# Patient Record
Sex: Female | Born: 1935 | Race: White | Hispanic: No | Marital: Married | Smoking: Never smoker
Health system: Southern US, Community
[De-identification: ages and names within clinical notes are randomized; demographics above are authoritative.]

## PROBLEM LIST (undated history)

## (undated) DIAGNOSIS — M810 Age-related osteoporosis without current pathological fracture: Secondary | ICD-10-CM

## (undated) DIAGNOSIS — I10 Essential (primary) hypertension: Secondary | ICD-10-CM

## (undated) HISTORY — DX: Essential (primary) hypertension: I10

## (undated) HISTORY — PX: OTHER SURGICAL HISTORY: SHX169

## (undated) HISTORY — DX: Age-related osteoporosis without current pathological fracture: M81.0

---

## 2011-11-28 IMAGING — MG MAMMOGRAPHY DIAGNOSTIC BILATERAL 3D TOMOSYNTHESIS WITH CAD
12 of 15 series · 12 of 31 positions shown · non-contrast
Comparison: none

MAMMOGRAPHY DIAGNOSTIC BILATERAL WITH CAD, 11/28/11:
HISTORY: Left lumpectomy and radiation therapy.
TECHNIQUE: Digital mammograms were obtained.  These were interpreted both
primarily and with the aid of a computer assisted detection system.
BREAST DENSITY:  (Level 3 of 4) The breast tissue is heterogeneously dense.
This may lower the sensitivity of mammography.

[L CC synth-2D]
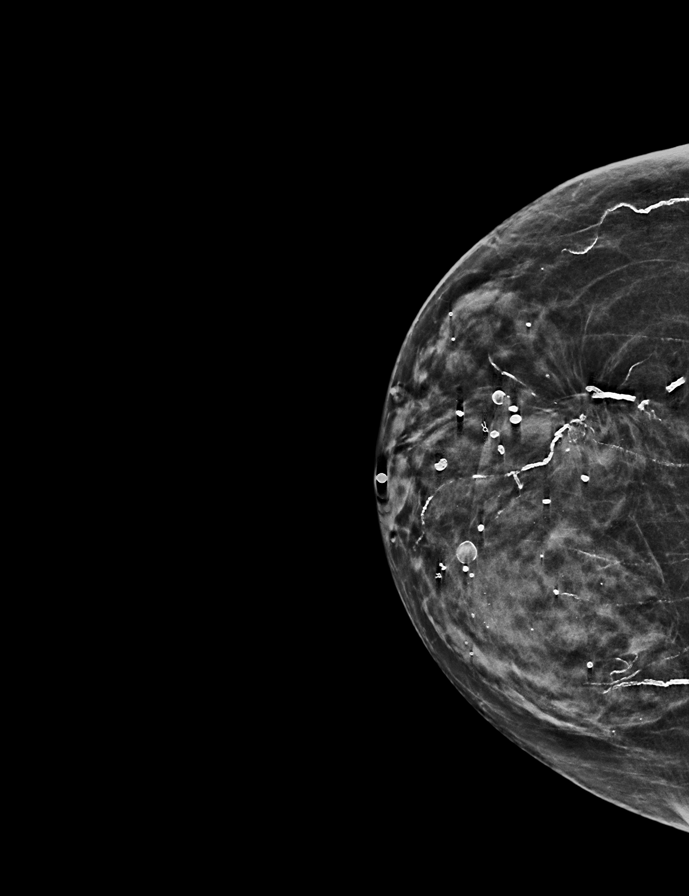

[L CC]
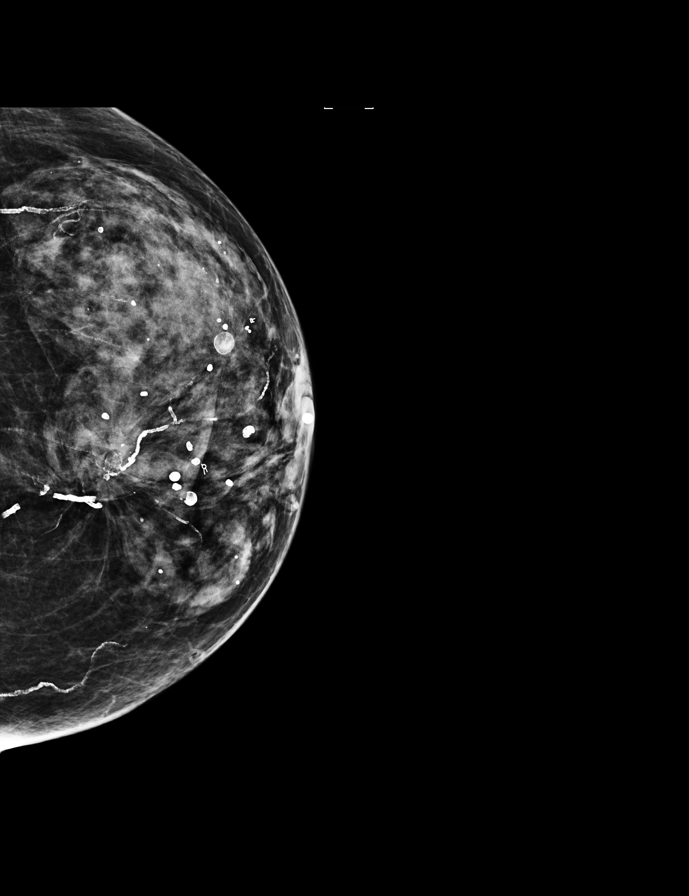

[L MLO]
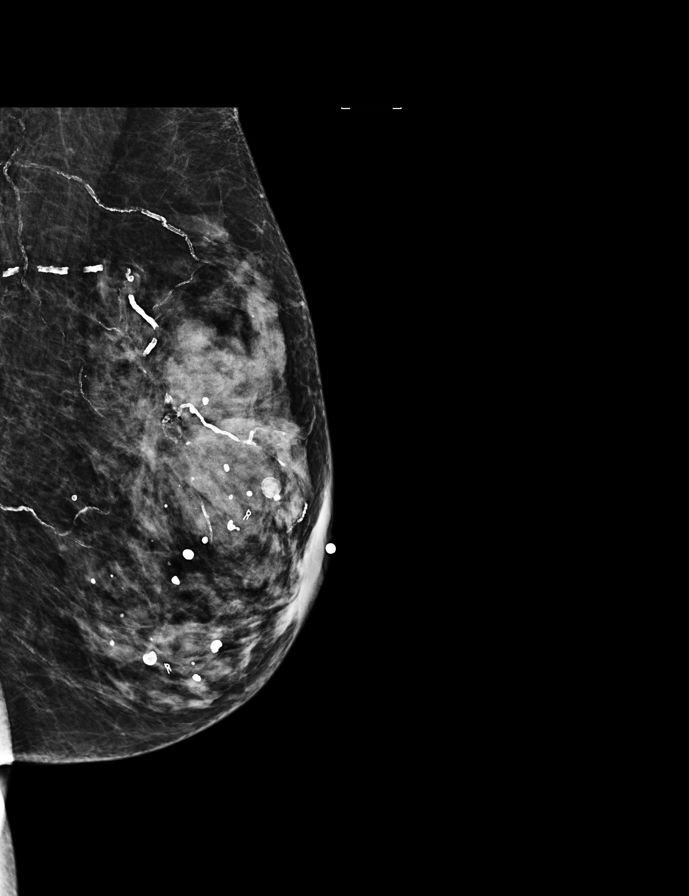

[R MLO]
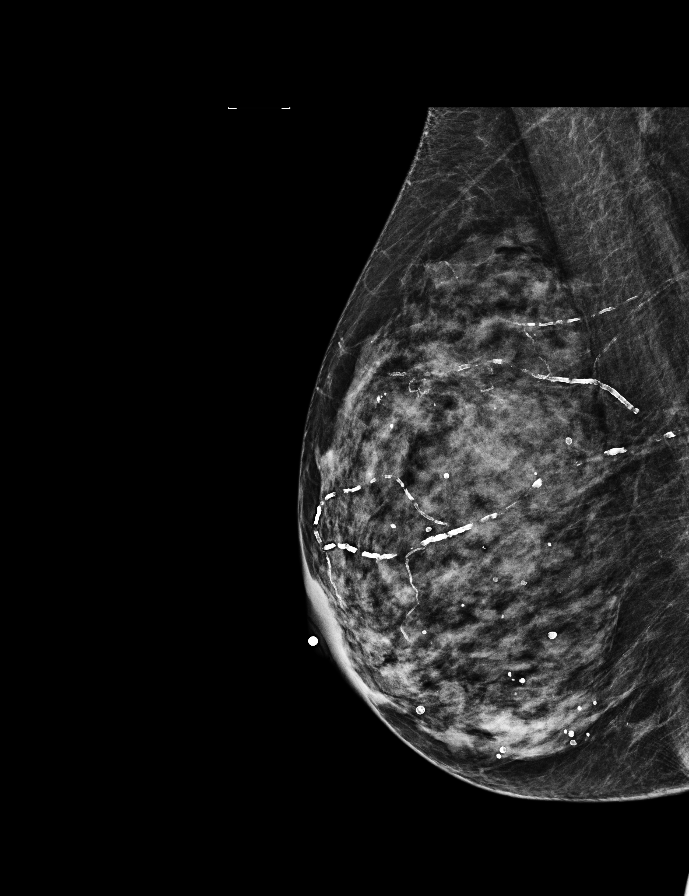

[L MLO synth-2D]
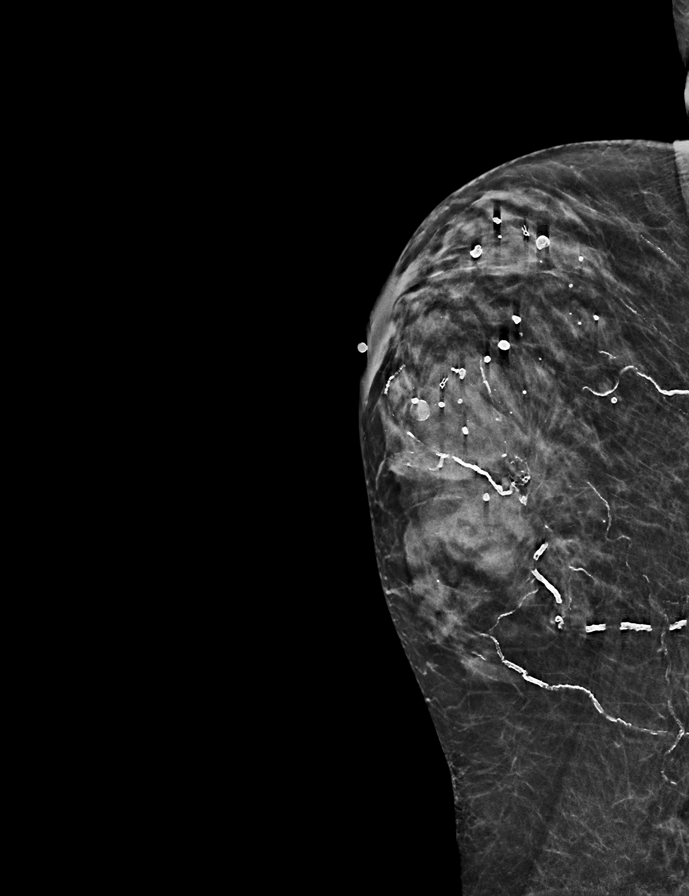

[R MLO synth-2D]
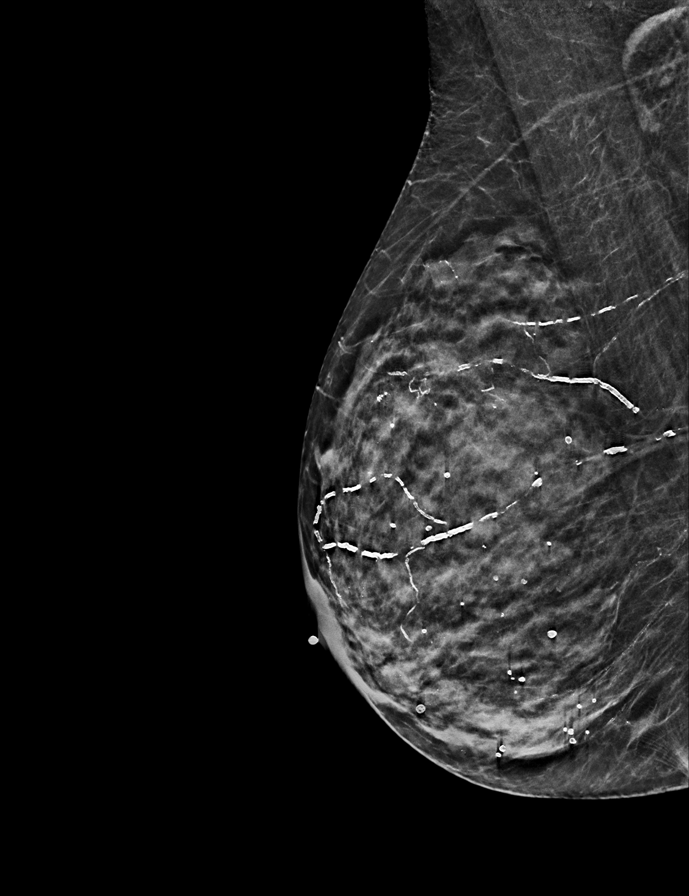

[R CC synth-2D]
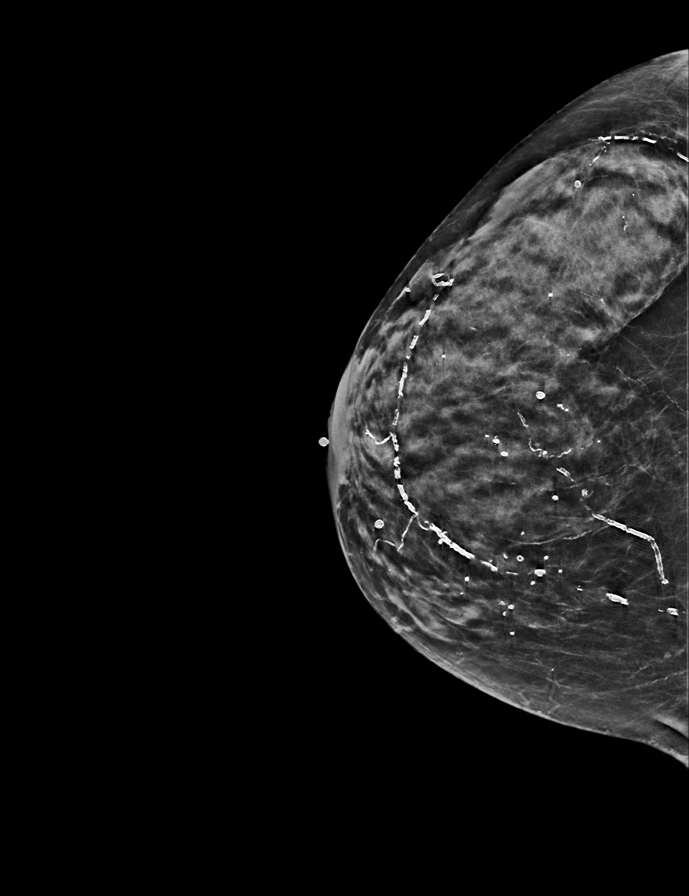

[L MLO tomo]
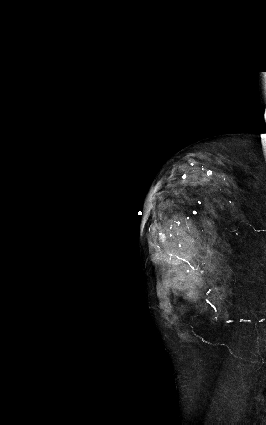

[R CC tomo]
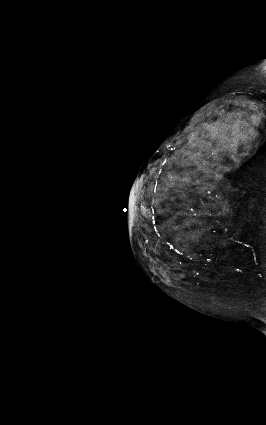

[L CC tomo]
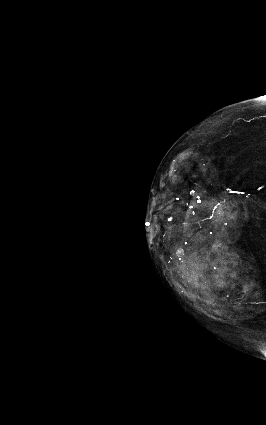

[R MLO tomo (1 of 2)]
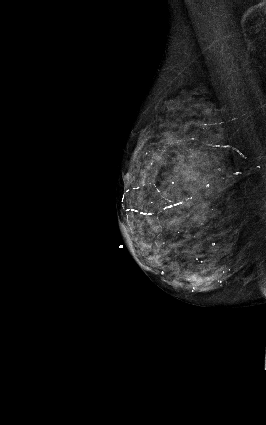

[R MLO tomo (2 of 2) · tomo slice 24/47.0]
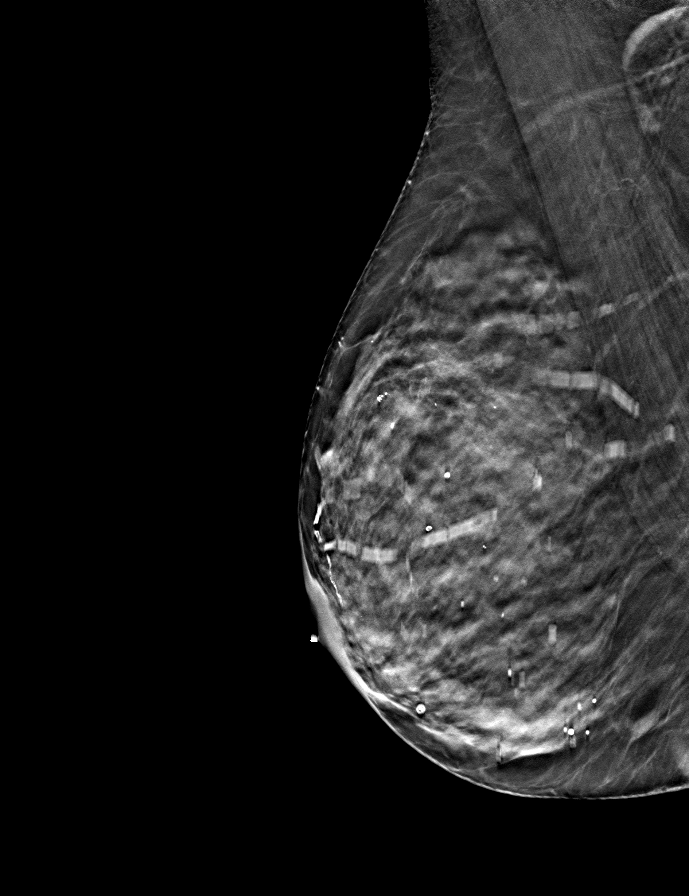

[12 of 31 positions shown; findings below may reference images not displayed]

FINDINGS: There are numerous benign appearing calcifications.  Post
treatment changes in the upper central left breast appear stable.  There is
no dominant or new, developing mass or suspicious cluster of
microcalcifications.  There is no skin thickening, architectural distortion
or nipple retraction.
IMPRESSION: (BI-RADS 2) Benign findings.  Routine mammographic follow-up is
recommended.

## 2012-11-28 IMAGING — MG MAMMOGRAPHY DIAGNOSTIC BILATERAL 3D TOMOSYNTHESIS WITH CAD
12 of 16 series · 12 of 32 positions shown · non-contrast
Comparison: Exams dating back to April 07, 2008
BREAST DENSITY: ( Level 4 of 4) The breast tissue is extremely dense which
could
obscure a lesion on mammography.

MAMMOGRAPHY DIAGNOSTIC BILATERAL 3D TOMOSYNTHESIS WITH CAD, 11/28/2012 [DATE]:
HISTORY: Radiation therapy and lumpectomy left breast 7331
TECHNIQUE: Digital mammograms and 3-D Tomosynthesis were obtained. These were
interpreted both primarily and with the aid of computer-aided detection
system.

[R MLO]
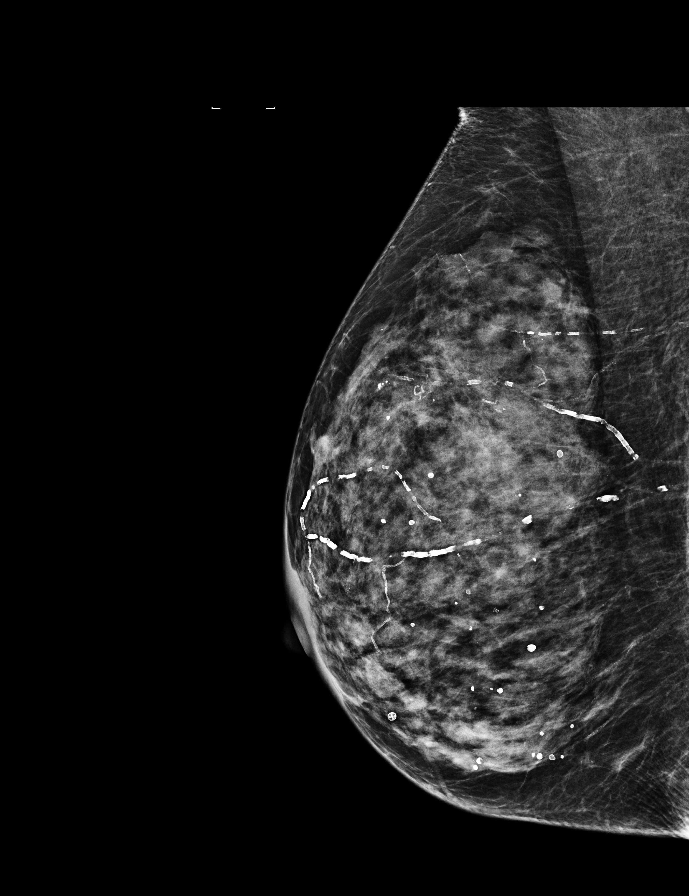

[L MLO]
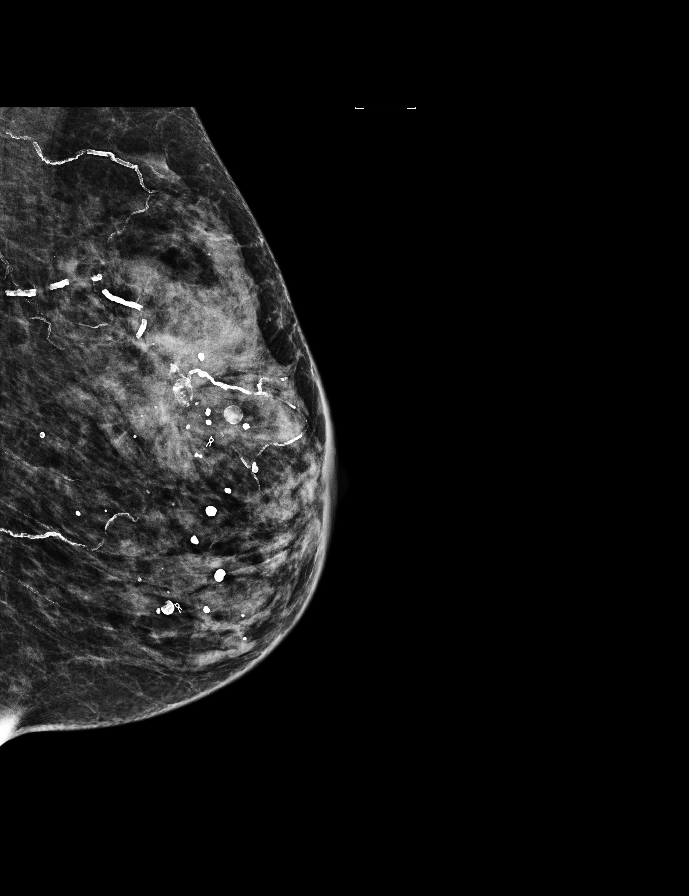

[R CC synth-2D]
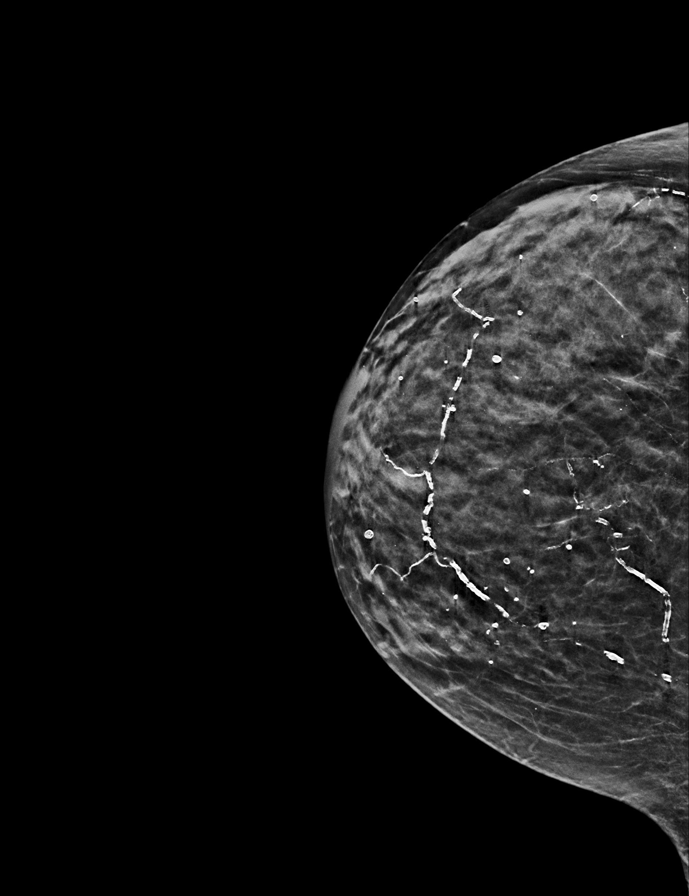

[L CC synth-2D]
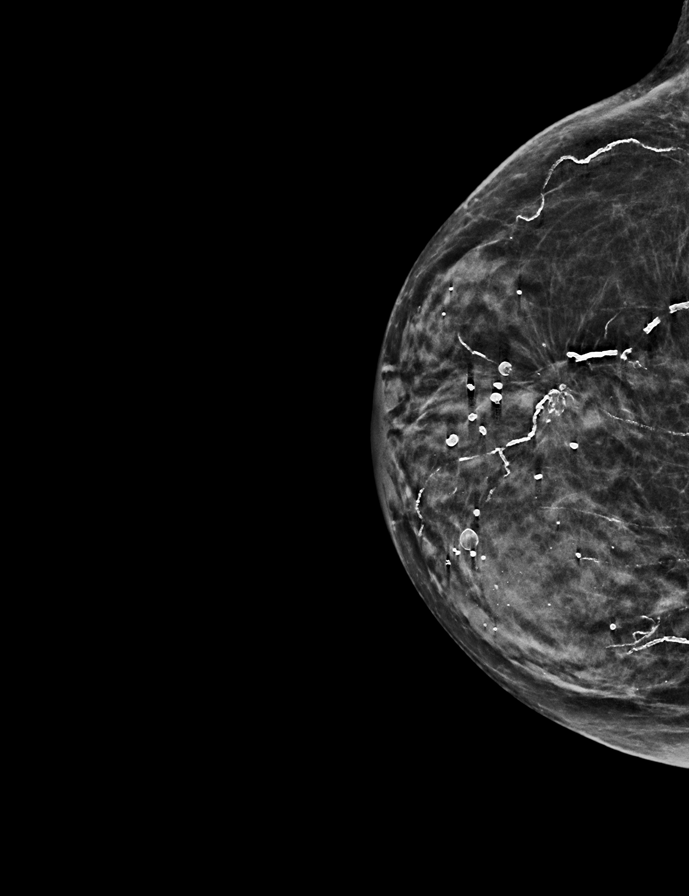

[L MLO synth-2D]
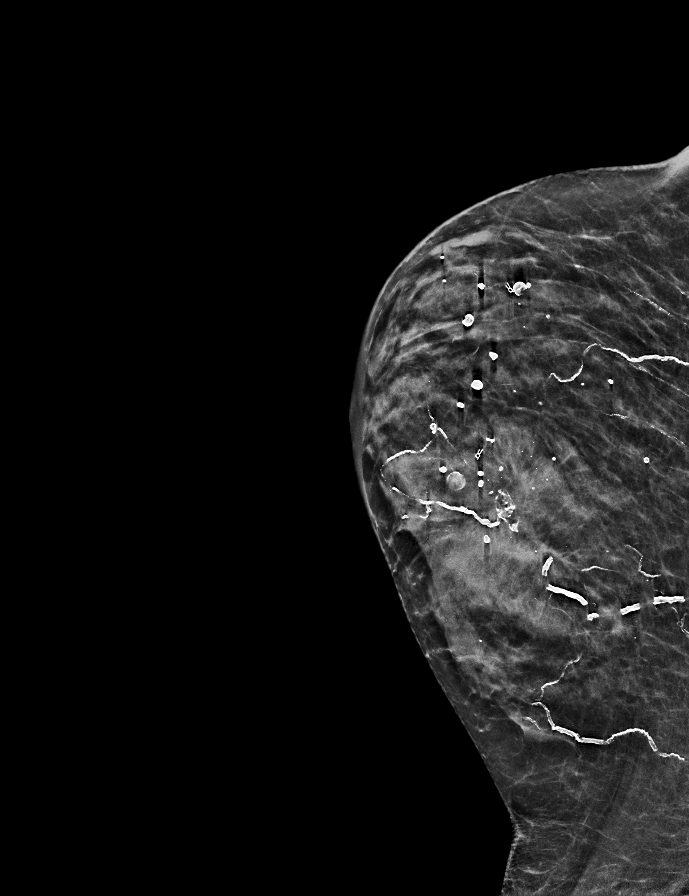

[R CC]
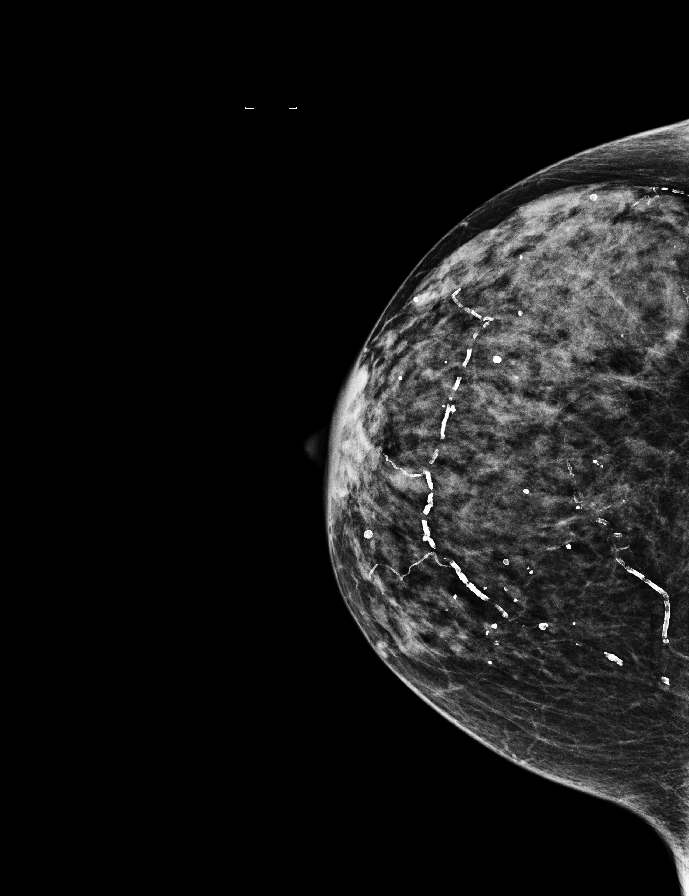

[R MLO synth-2D]
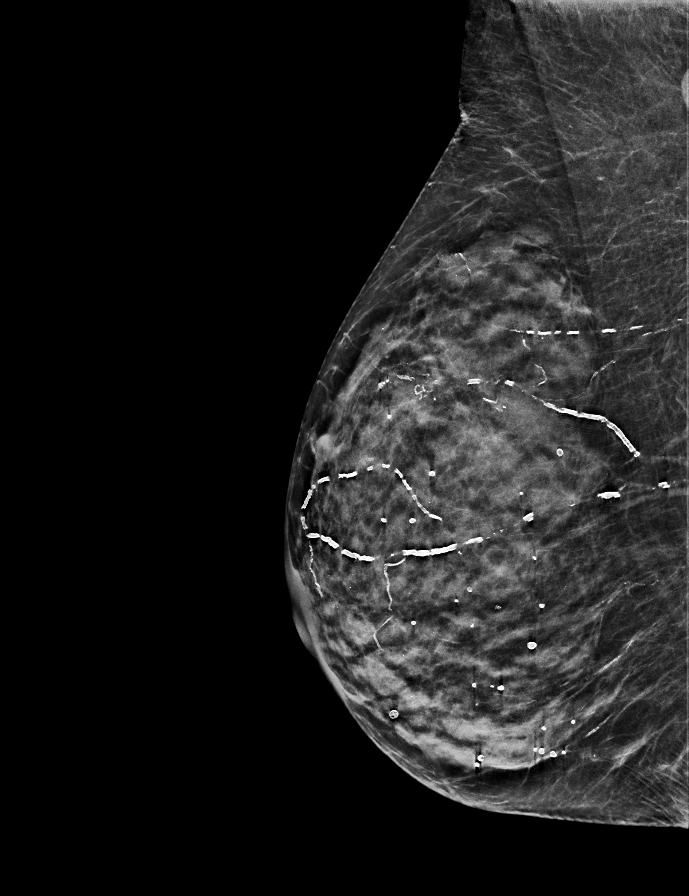

[L CC]
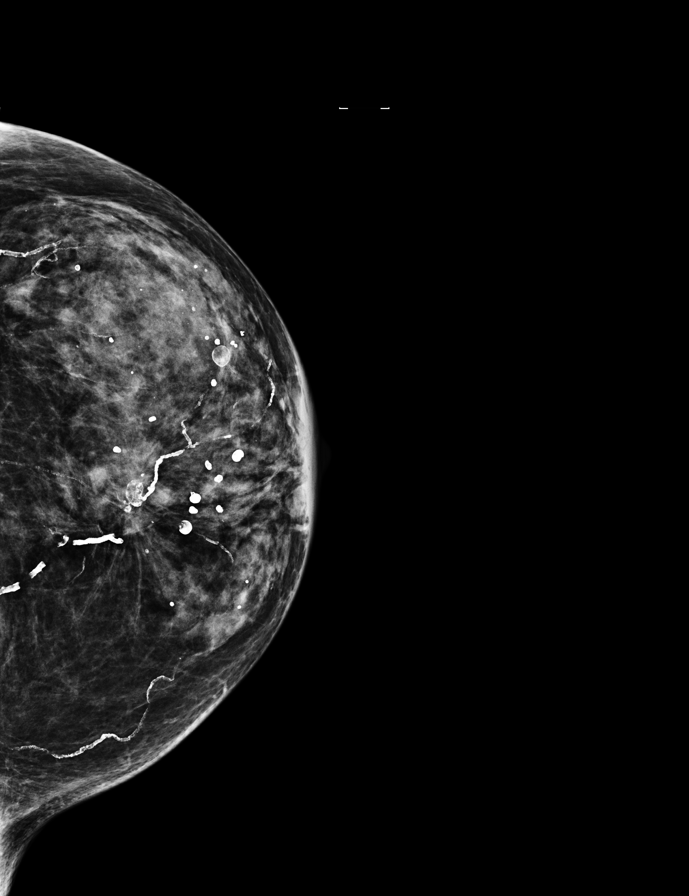

[L MLO tomo]
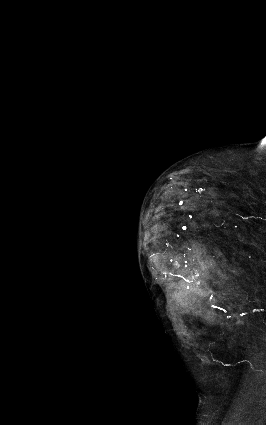

[R MLO tomo]
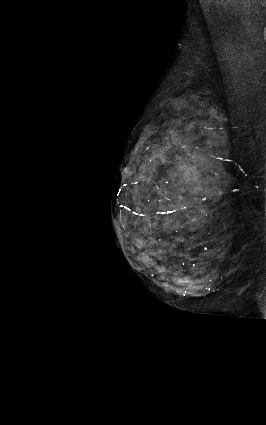

[L CC tomo]
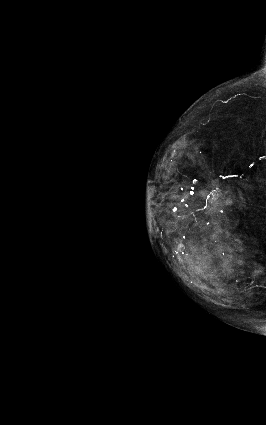

[R CC tomo]
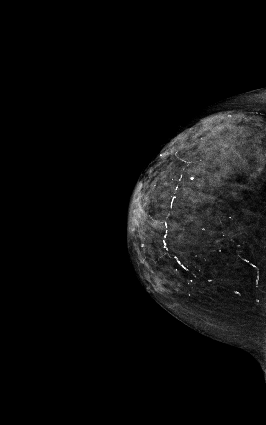

[12 of 32 positions shown; findings below may reference images not displayed]

FINDINGS: Posttreatment changes are seen on the left. Benign calcifications are
seen bilaterally. Stable mammographic appearance. Extensive vascular
calcifications.
IMPRESSION: ( BI-RADS 2) Benign findings. Routine mammographic follow-up is recommended.

## 2014-03-25 IMAGING — MG MAMMOGRAPHY DIAGNOSTIC BILATERAL 3D TOMOSYNTHESIS WITH CAD
12 of 16 series · 12 of 32 positions shown · non-contrast
Comparison: 11/28/2012 through 04/07/2008.

MAMMOGRAPHY DIAGNOSTIC BILATERAL 3D TOMOSYNTHESIS WITH CAD, 03/25/2014 [DATE]:
CLINICAL INDICATION: Diagnostic mammogram due to history of left breast
malignancy.
Nine.
TECHNIQUE: Digital mammograms and 3-D Tomosynthesis were obtained. These were
interpreted both primarily and with the aid of computer-aided detection
system.

[L CC]
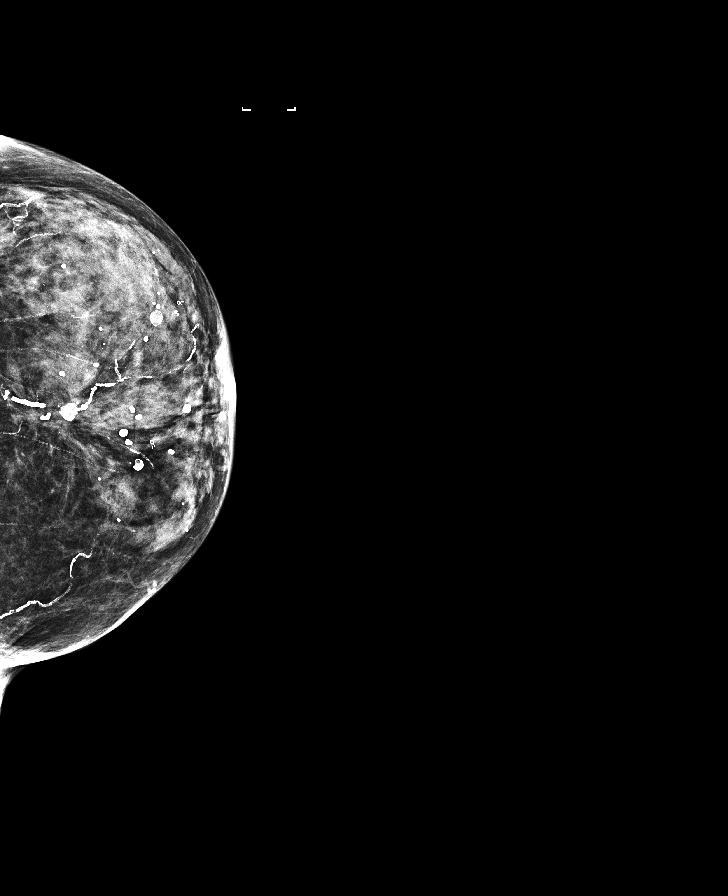

[L MLO]
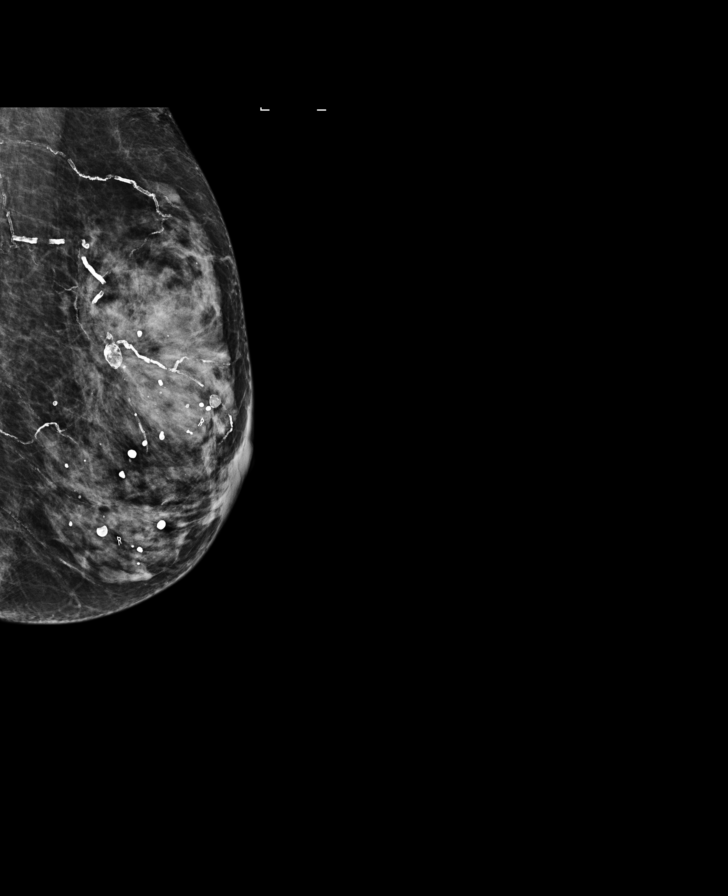

[L MLO synth-2D]
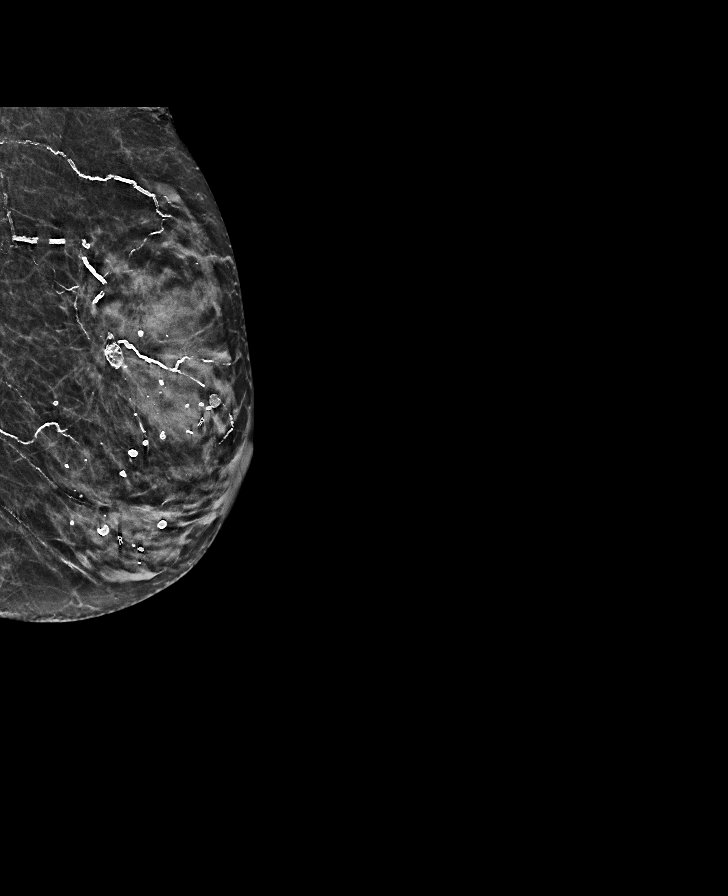

[R MLO synth-2D]
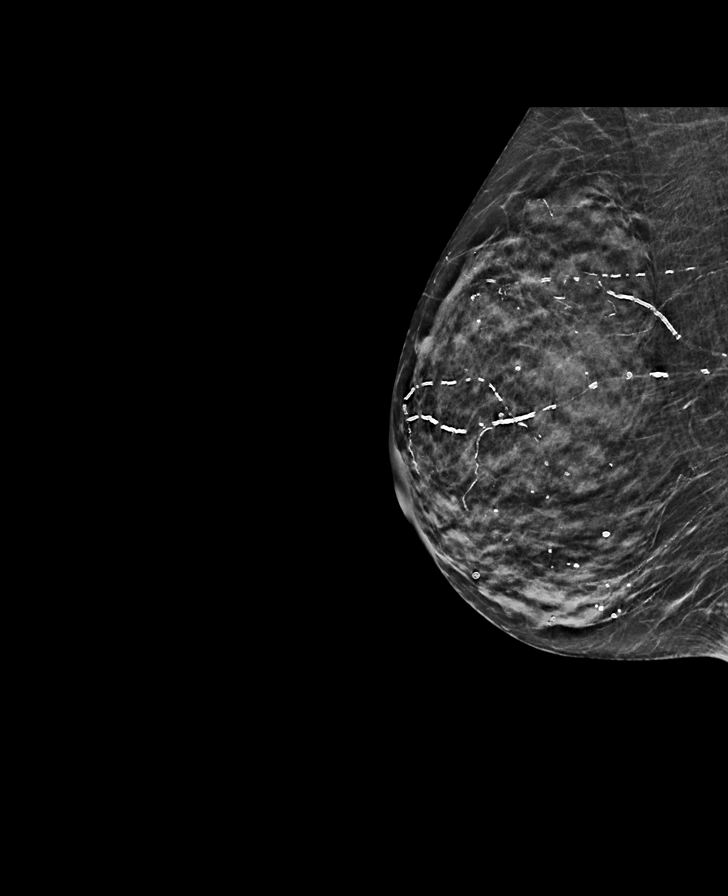

[R MLO]
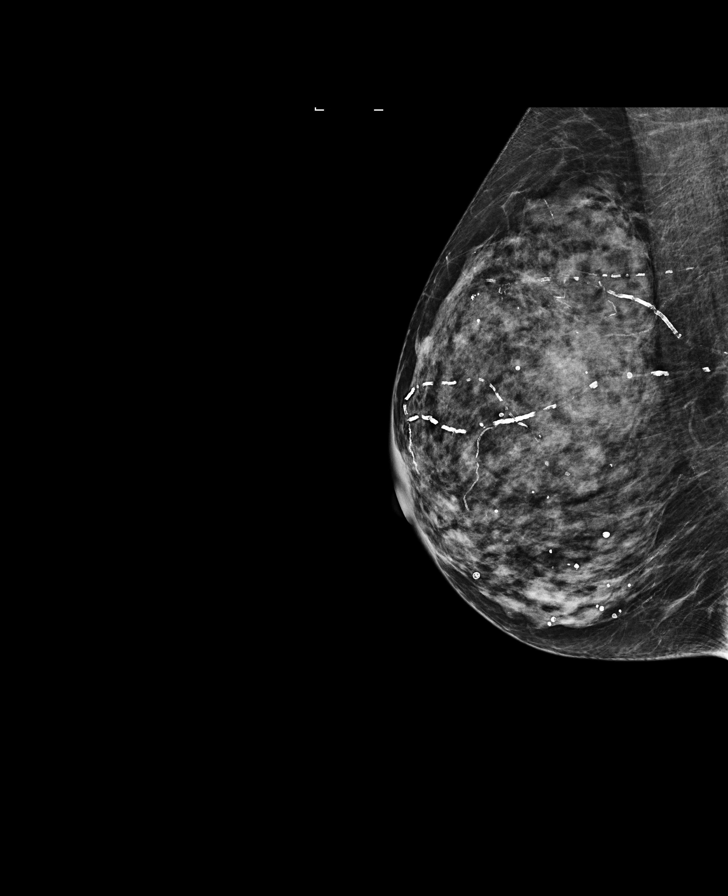

[R CC]
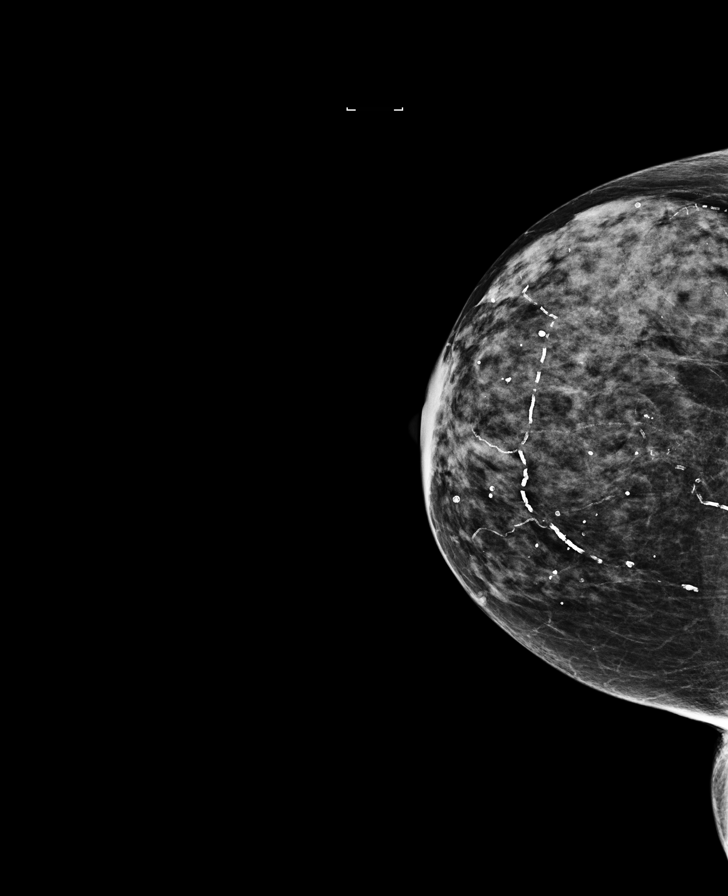

[R CC synth-2D]
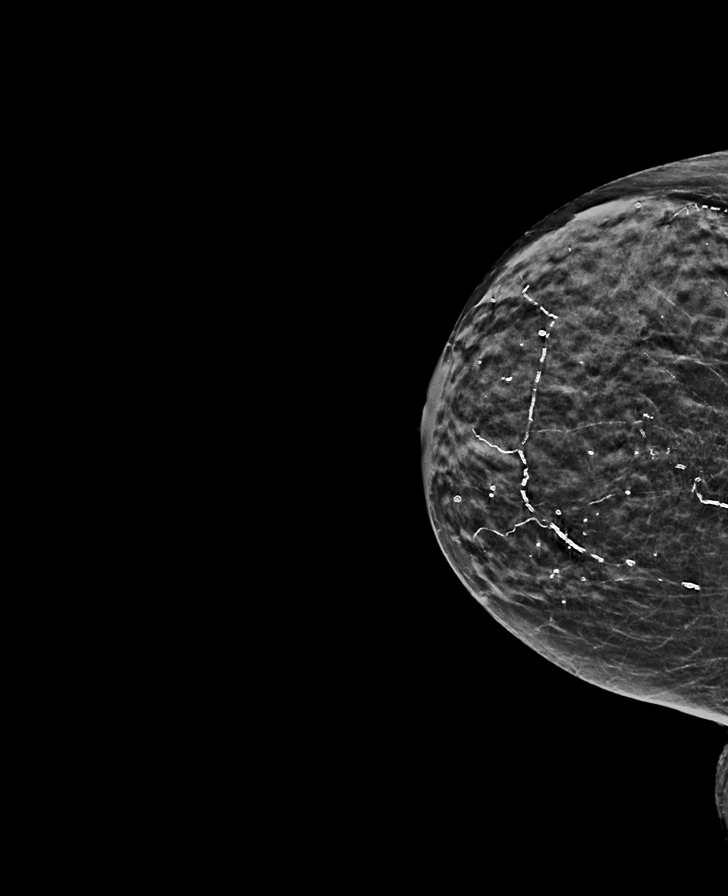

[L CC synth-2D]
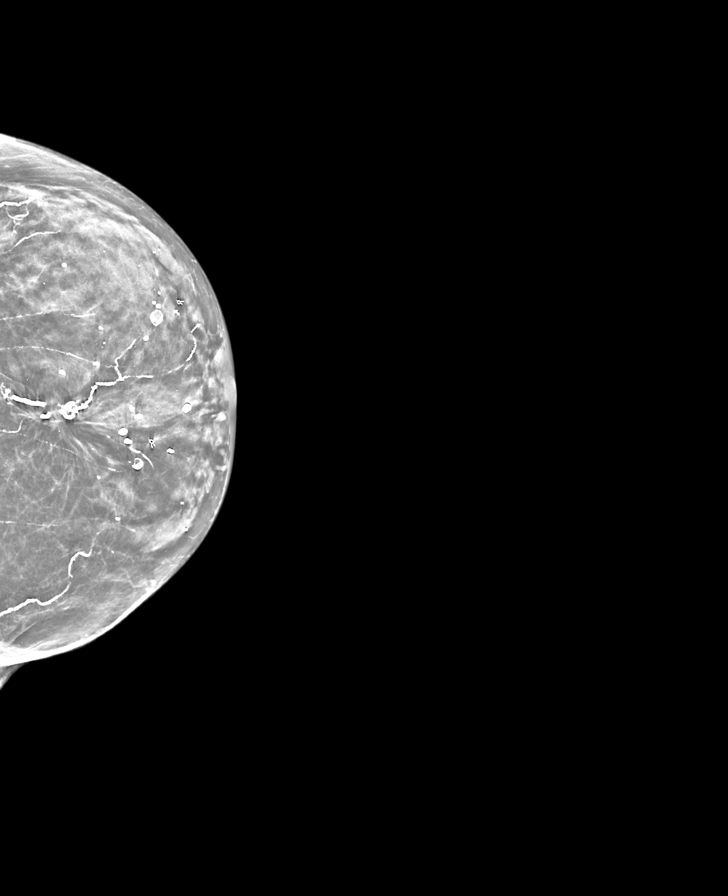

[R MLO tomo]
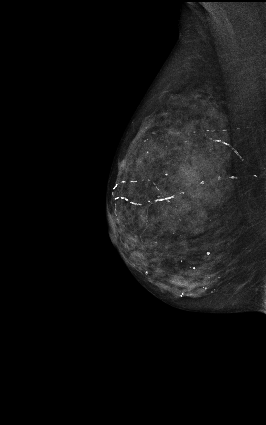

[L MLO tomo]
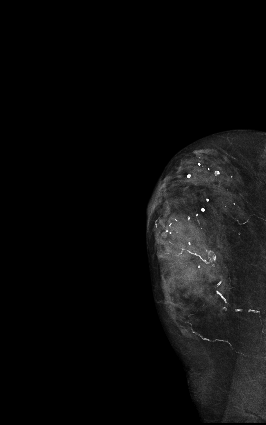

[L CC tomo]
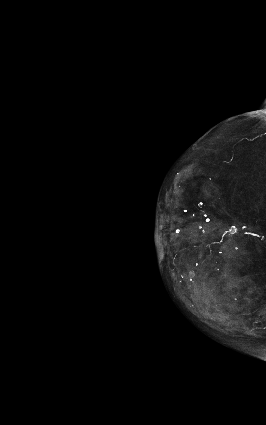

[R CC tomo]
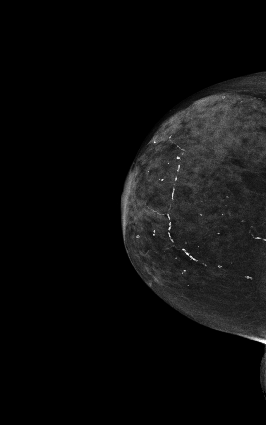

[12 of 32 positions shown; findings below may reference images not displayed]

BREAST DENSITY: (Level D) The breasts are extremely dense which lowers the
sensitivity of mammography.
FINDINGS: Postsurgical scarring is in the left breast. No mammographically
suspicious abnormality and no significant change.
IMPRESSION: ( BI-RADS 2) Benign findings. Routine mammographic follow-up is recommended.

## 2017-11-21 IMAGING — MG MAMMOGRAPHY SCREENING BILATERAL 3D TOMOSYNTHESIS WITH CAD
8 series · 9 of 24 positions shown · non-contrast
Comparison: July 26, 2016 
BREAST DENSITY: (Level C) The breasts are heterogeneously dense, which may 
obscure small masses.

MAMMOGRAPHY SCREENING BILATERAL 3D TOMOSYNTHESIS WITH CAD, 11/21/2017 [DATE]: 
CLINICAL INDICATION: Screening mammogram, personal history of left breast CA 
in 
4334. Status post left lumpectomy and radiation therapy.
TECHNIQUE: Digital bilateral mammograms and 3-D Tomosynthesis were obtained. 
These were interpreted both primarily and with the aid of computer-aided 
detection system.

[L MLO]
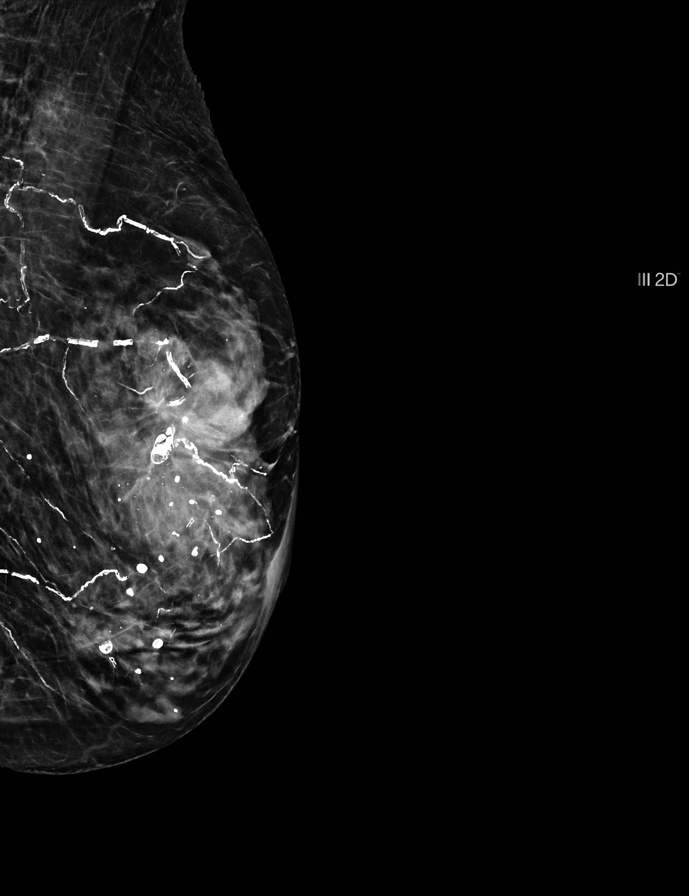

[R CC]
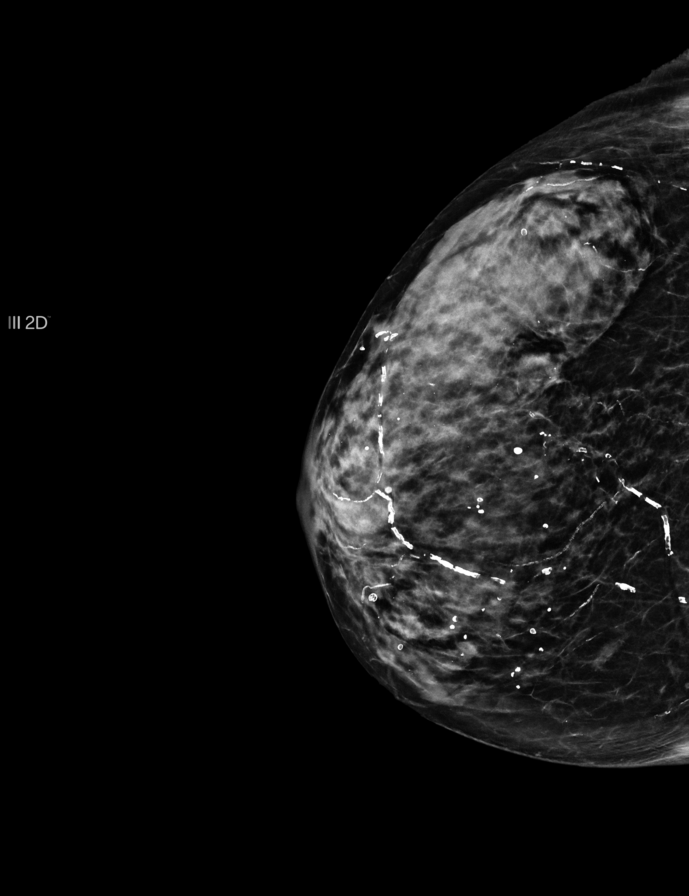

[L CC]
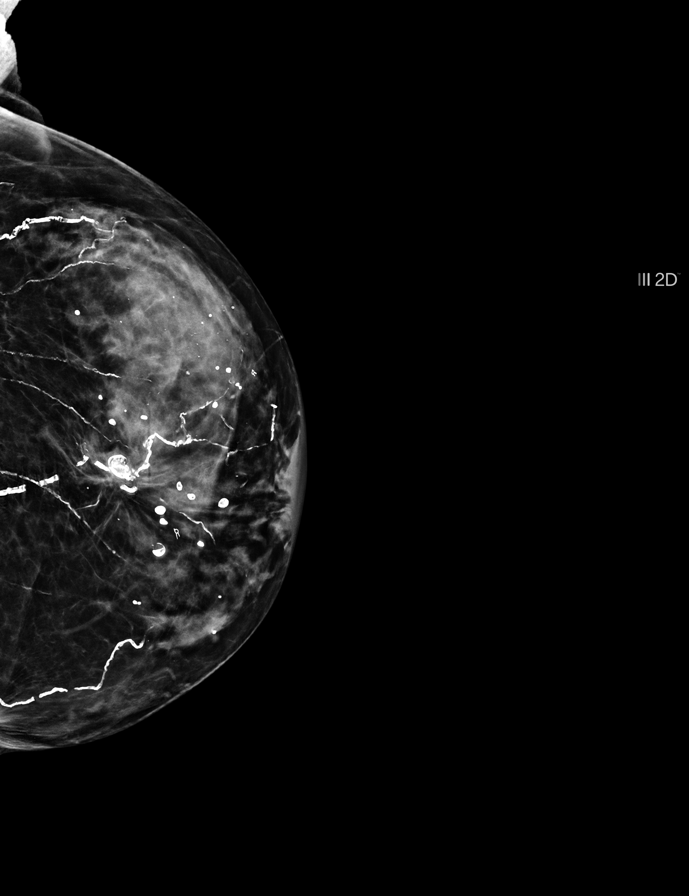

[R MLO]
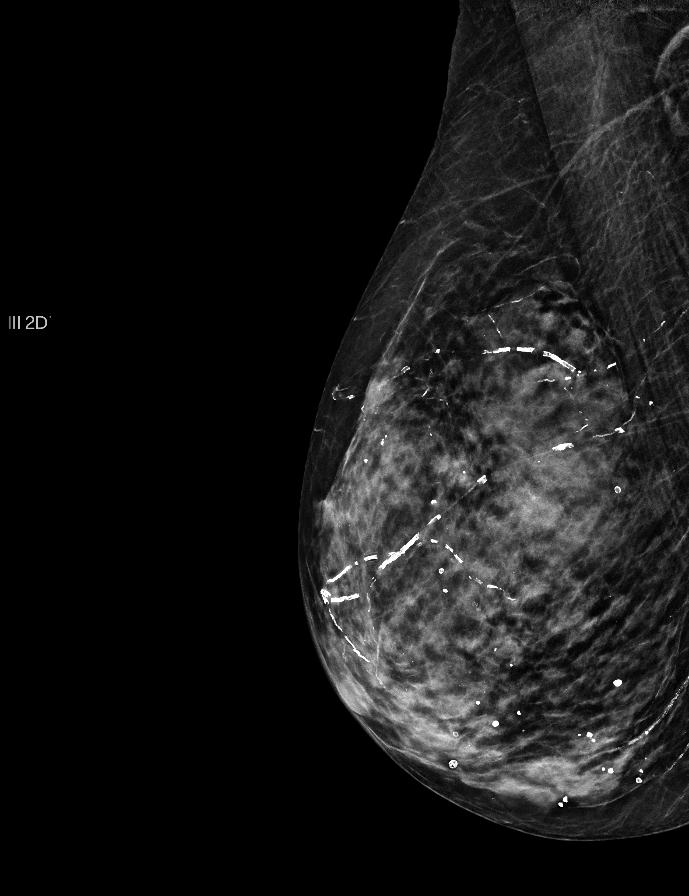

[L CC tomo · 2 of 56 frames shown]
[frame 19/56]
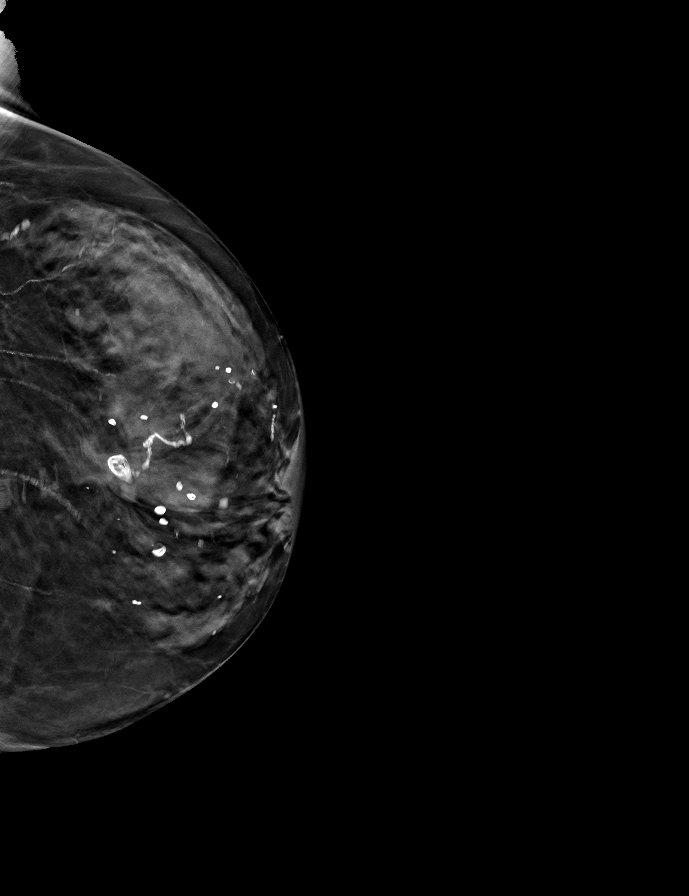
[frame 29/56]
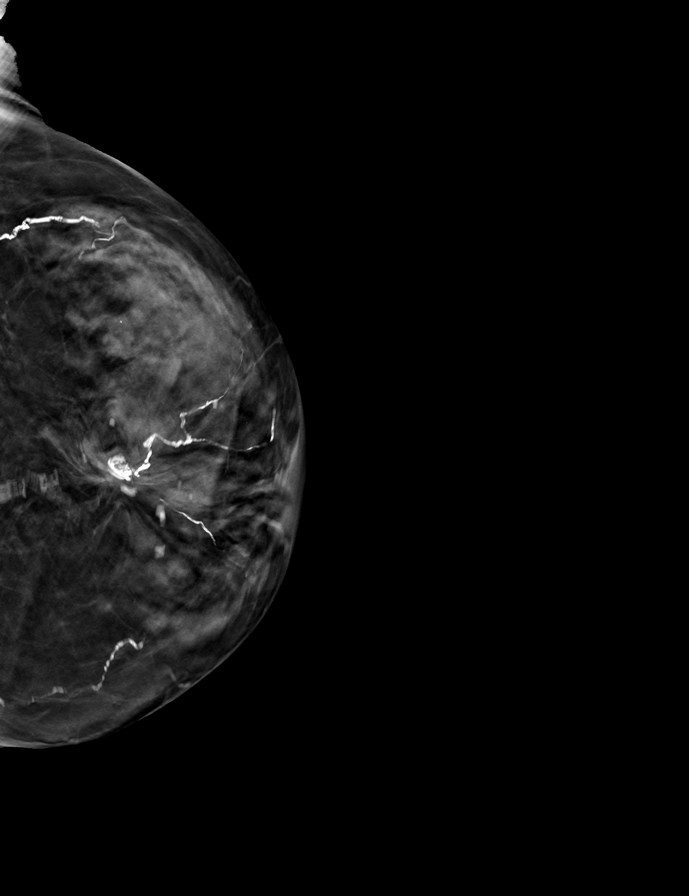

[L MLO tomo · tomo slice 27/53.0]
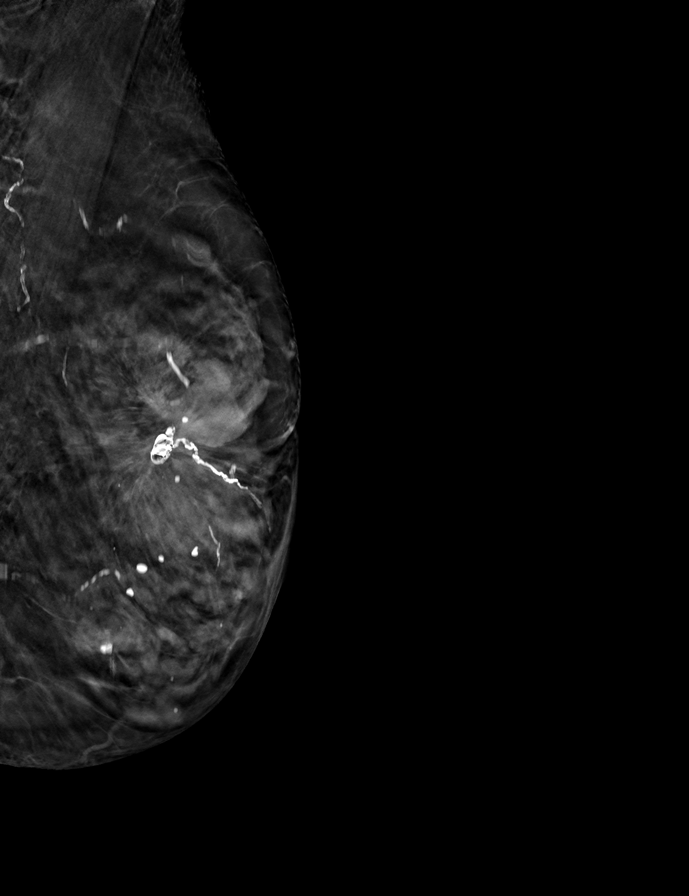

[R MLO tomo · tomo slice 22/43.0]
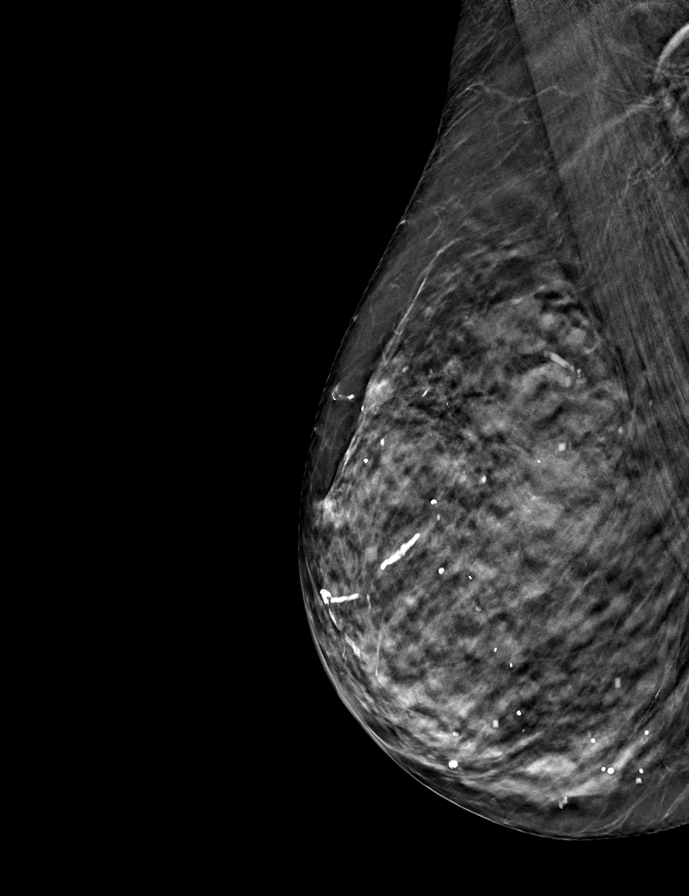

[R CC tomo · tomo slice 23/45.0]
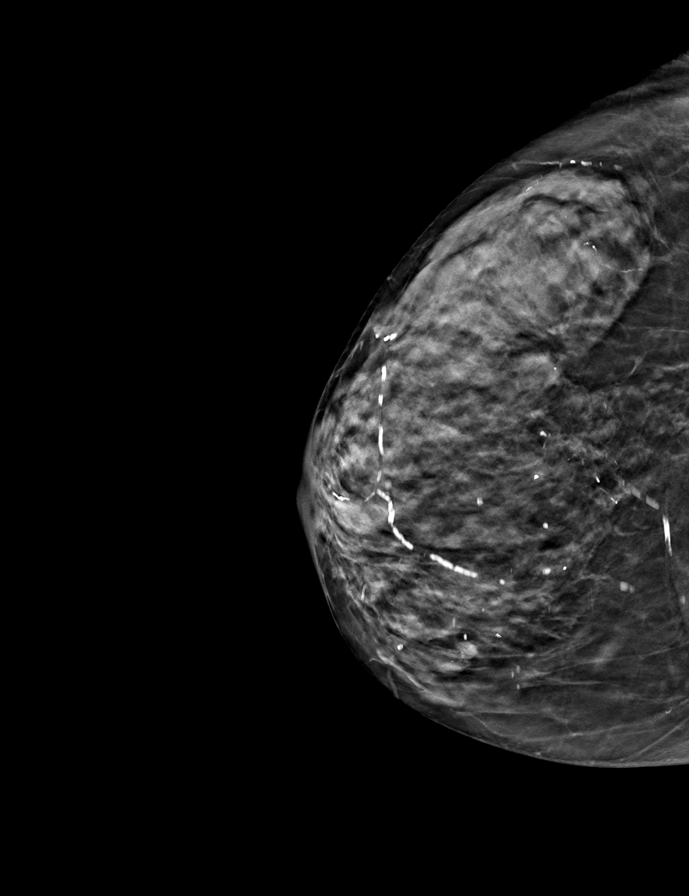

[9 of 24 positions shown; findings below may reference images not displayed]

FINDINGS: Posttreatment changes in the left breast is stable with no evidence 
of 
recurrent tumor. There is no dominant mass or tumor calcification in both 
breasts to suggest malignancy. No change from the prior mammogram. Prominent 
arterial vascular calcifications are present from atherosclerosis.
IMPRESSION: ( BI-RADS 2) Benign findings. Routine mammographic follow-up is recommended.

## 2018-02-06 HISTORY — PX: EXPLORATORY LAPAROTOMY: SUR591

## 2018-07-25 IMAGING — CT CT LEFT HIP WITH CONTRAST
2 of 4 series · 16 of 46 positions shown, 18 images · IV contrast (ISOVUE 300)
Comparison: There are no previous exams available for comparison.

CT LEFT HIP WITH CONTRAST, 07/25/2018 [DATE]: 
CLINICAL INDICATION: Left hip arthroplasty in 3441. Postoperative swelling. 
Cervical trauma removal. Recurrent left hip swelling/mass. 
A search for DICOM formatted images was conducted for prior CT imaging studies 
completed at a non-affiliated media free facility.
TECHNIQUE: The left hip was scanned with 100 cc of intravenous [id number] in the 
arterial phase. Coronal and sagittal reformatted images obtained.

[Series 2: bilat hips with (person_name) · axial · 0.97mm/px · z∈[-286,-58]mm · 13 of 88 slices shown, 15 images]
[im 6/88  soft-tissue]
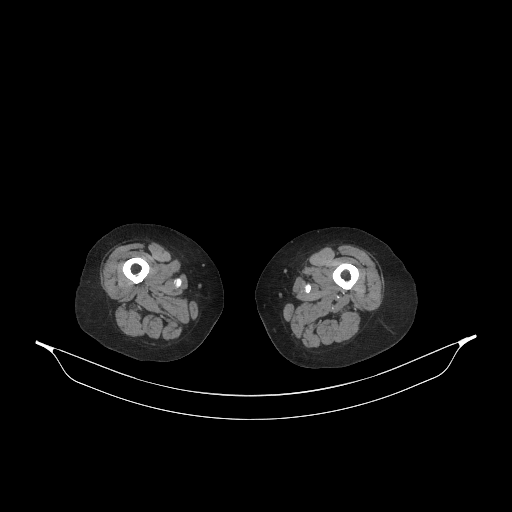
[im 6/88  bone]
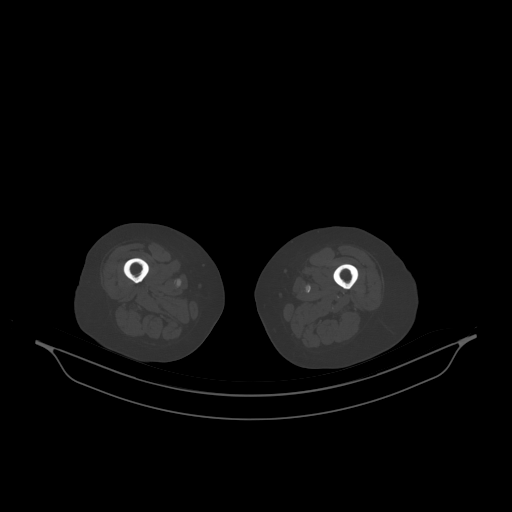
[im 12/88  soft-tissue]
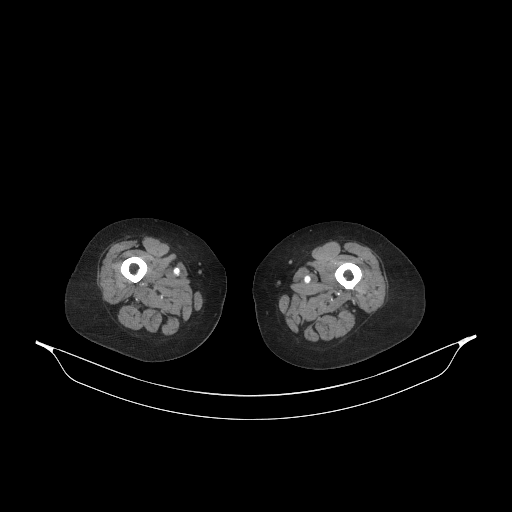
[im 18/88  soft-tissue]
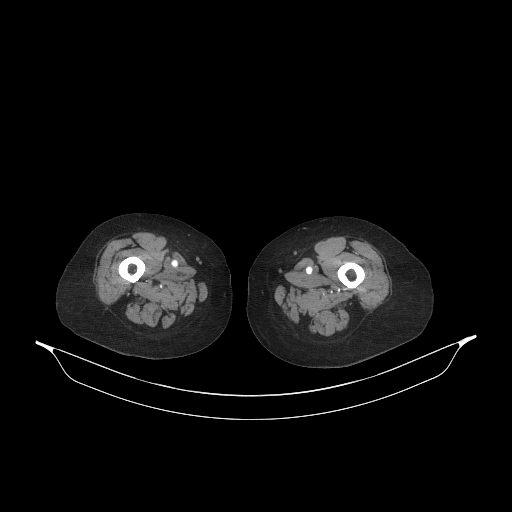
[im 24/88  soft-tissue]
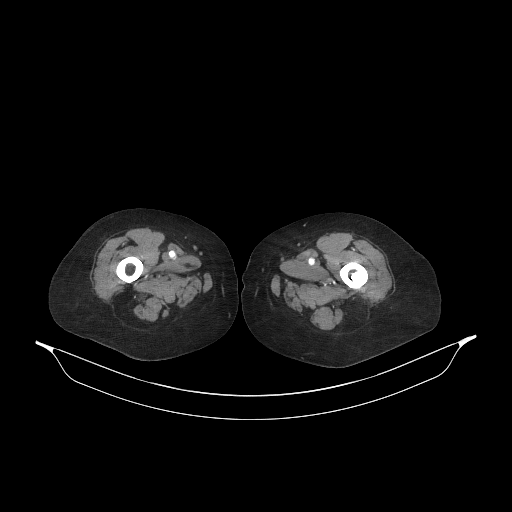
[im 30/88  soft-tissue]
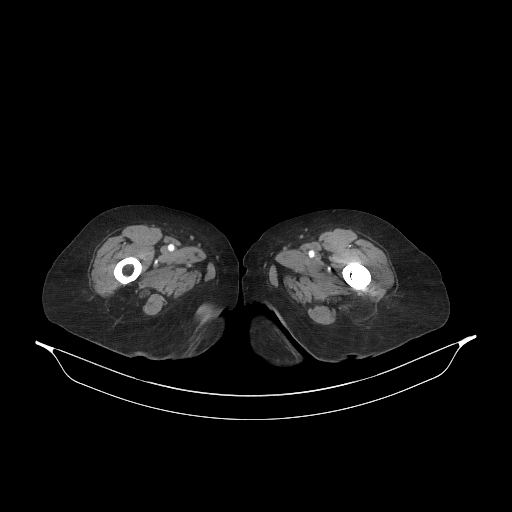
[im 35/88  soft-tissue]
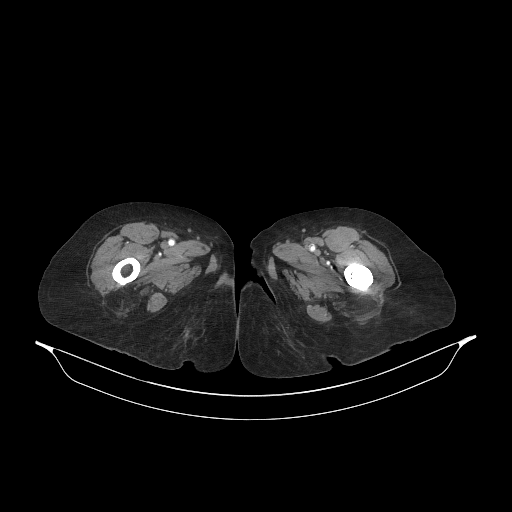
[im 47/88  soft-tissue]
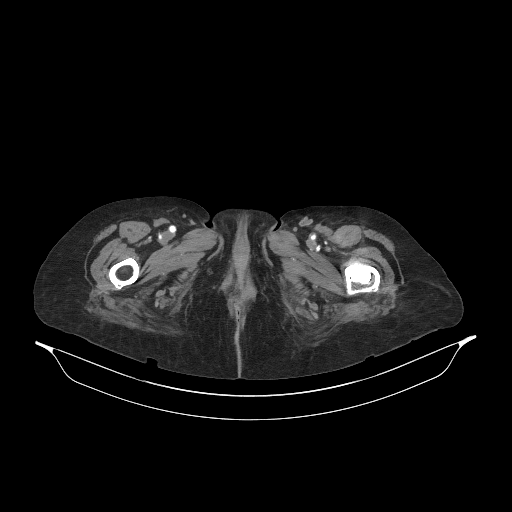
[im 53/88  soft-tissue]
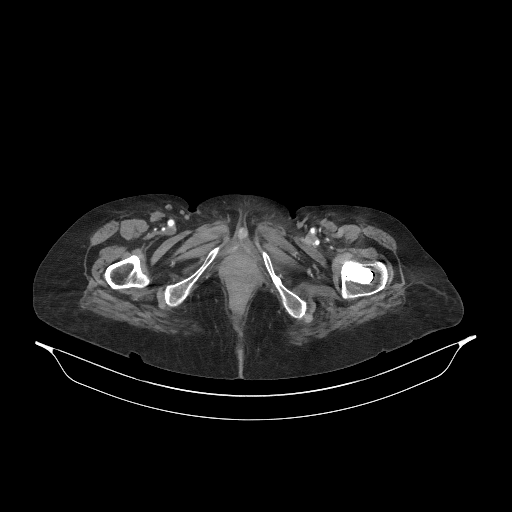
[im 59/88  soft-tissue]
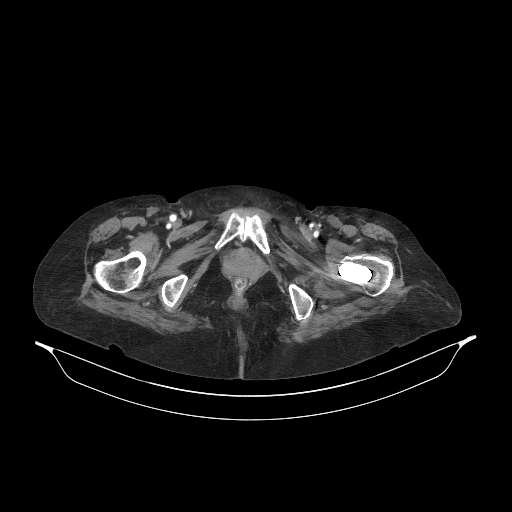
[im 59/88  bone]
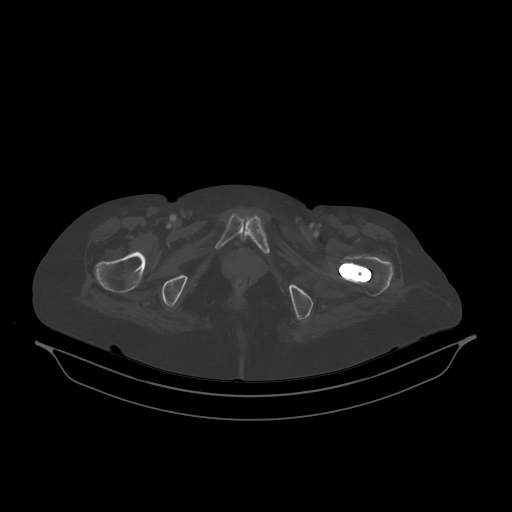
[im 64/88  soft-tissue]
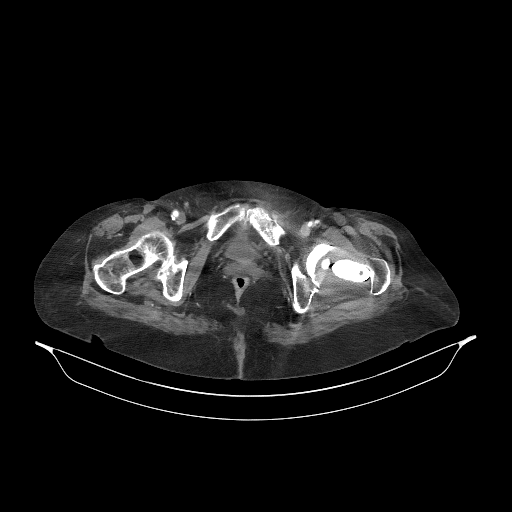
[im 70/88  soft-tissue]
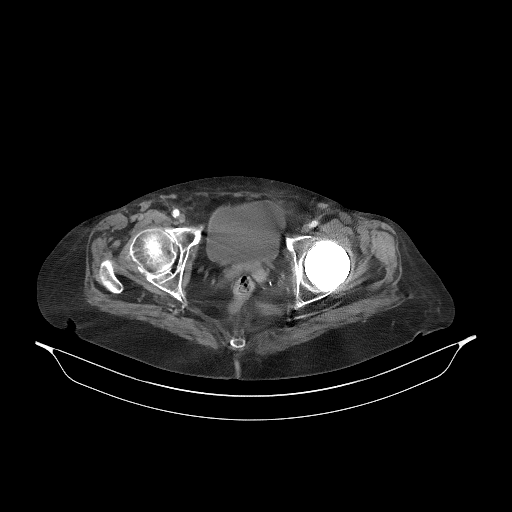
[im 76/88  soft-tissue]
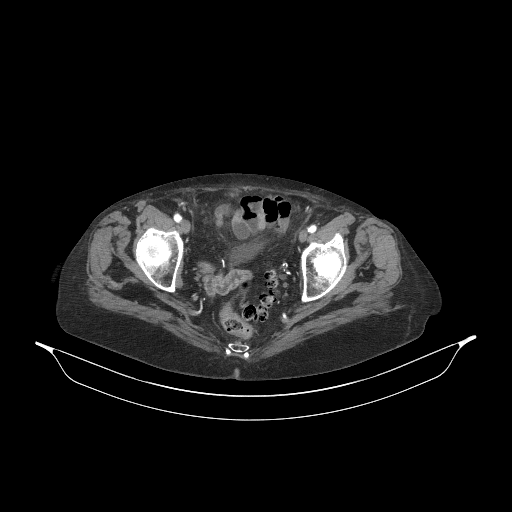
[im 82/88  soft-tissue]
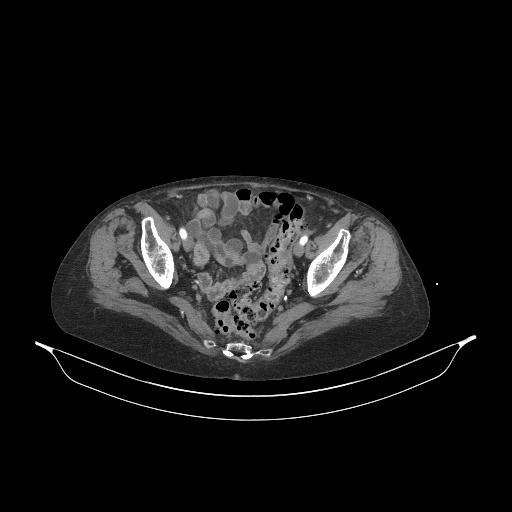

[Series 4: left hip cor w · coronal · 0.51mm/px · 3 of 82 slices shown]
[im 28/82  soft-tissue]
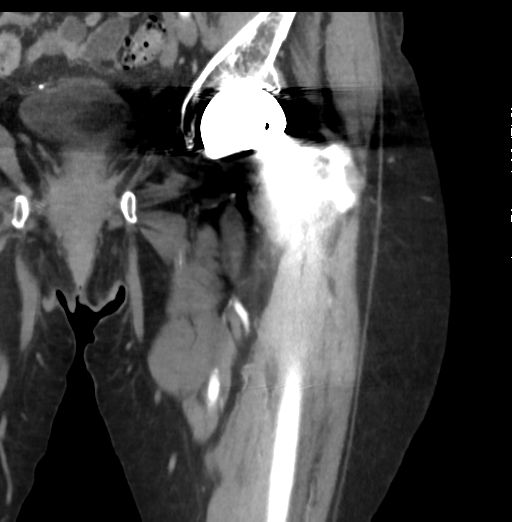
[im 37/82  soft-tissue]
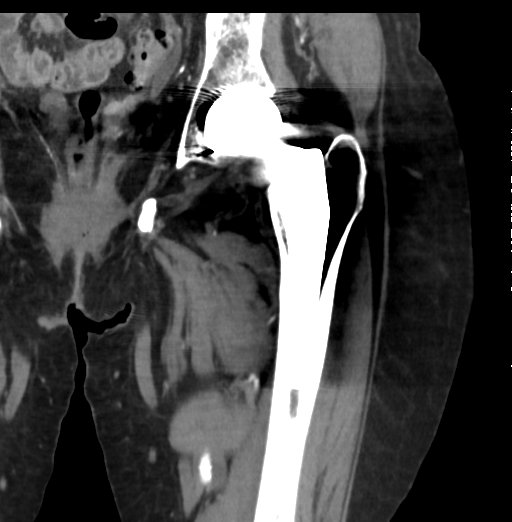
[im 46/82  soft-tissue]
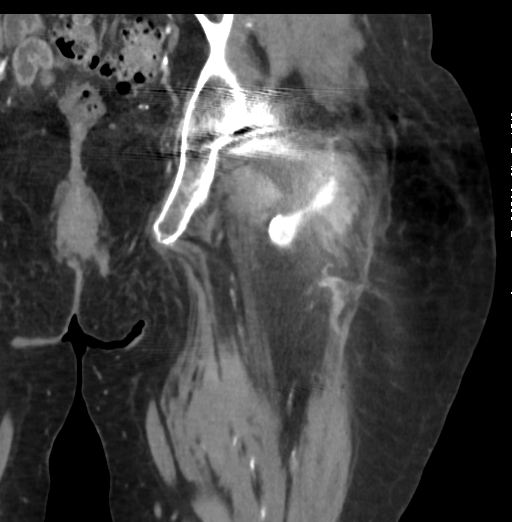

[16 of 46 positions shown; findings below may reference images not displayed]

FINDINGS: LEFT HIP/HEMIPELVIS: Left bipolar hip prosthesis with noncemented femoral 
component is well seated. No femoral periprosthetic fracture. Old left superior 
and inferior pubic rami fracture deformities. 
RIGHT HIP/HEMIPELVIS: Minimal degenerative change of the right hip. Both femoral 
heads maintain a spherical configuration without evidence of avascular necrosis 
or subarticular collapse. No abnormal morphology of the proximal femurs or 
acetabulum to predispose to impingement. 
DENSITY: Osteopenia. No marrow replacing lesion. 
PUBIC SYMPHYSIS: Moderate degenerative change and chondrocalcinosis. 
SOFT TISSUES: Atherosclerosis. Veins cannot be assessed for patency in this 
arterial phase study. No mass or fluid collection. Colonic diverticulosis 
without evidence of diverticulitis. Hysterectomy. Pelvic clips. No adenopathy or 
free fluid.
IMPRESSION: 1, Left bipolar hip prosthesis is well seated. 
2. Old left superior and inferior pubic rami fracture deformities. 
3. Minimal degenerative change of the right hip. 
4. Moderate degenerative change and chondrocalcinosis of the pubic symphysis. 
5. Osteopenia: DXA may be helpful for further evaluation. 
RADIATION DOSE REDUCTION: All CT scans are performed using radiation dose 
reduction techniques, when applicable.  Technical factors are evaluated and 
adjusted to ensure appropriate moderation of exposure.  Automated dose 
management technology is applied to adjust the radiation doses to minimize 
exposure while achieving diagnostic quality images.

## 2018-10-09 ENCOUNTER — Ambulatory Visit (INDEPENDENT_AMBULATORY_CARE_PROVIDER_SITE_OTHER): Payer: Medicare Other | Admitting: Orthopaedic Surgery

## 2018-10-09 ENCOUNTER — Encounter (INDEPENDENT_AMBULATORY_CARE_PROVIDER_SITE_OTHER): Payer: Self-pay | Admitting: Orthopaedic Surgery

## 2018-10-09 ENCOUNTER — Ambulatory Visit (INDEPENDENT_AMBULATORY_CARE_PROVIDER_SITE_OTHER): Payer: Medicare Other

## 2018-10-09 VITALS — BP 150/96 | HR 80 | Temp 98.1°F | Ht 60.0 in | Wt 110.0 lb

## 2018-10-09 DIAGNOSIS — M25551 Pain in right hip: Secondary | ICD-10-CM

## 2018-10-09 DIAGNOSIS — M25552 Pain in left hip: Secondary | ICD-10-CM

## 2018-10-09 NOTE — Progress Notes (Signed)
Orthopaedic Trauma New Patient Visit    Chief Complaint   Patient presents with    Pain     left hip pain and nodule since SX  2.5 years ago in [place]       HPI: Alexandra Graves is a 85 y.o. female.  85 year old female presents for evaluation of left hip discomfort.  She sustained a left hip fracture 2-1/2 years ago and underwent a bipolar hemiarthroplasty with a posterior lateral approach in [place].  She noted postoperatively that she had quite a bit of fullness behind the incision to the degree that she saw a plastic surgeon who attempted to remove the mass but was not successful and she still has a pretty good sized lump.  The lump itself is not painful and the hip does hurt a bit with ambulation.    ROS: Denies fevers, chills, chest pain, shortness of breath, nausea, vomiting, or diarrhea.    Patient History:  There are no active problems to display for this patient.      Current Outpatient Medications   Medication Sig Dispense Refill    Bystolic 5 MG tablet       ciclopirox (PENLAC) 8 % solution       losartan-hydrochlorothiazide (HYZAAR) 100-25 MG per tablet       losartan-hydrochlorothiazide (HYZAAR) 100-25 MG per tablet       omeprazole (PriLOSEC) 40 MG capsule       propafenone (RYTHMOL) 150 MG tablet       spironolactone (ALDACTONE) 25 MG tablet        No current facility-administered medications for this visit.      .  Allergies: Patient has no known allergies.  History reviewed. No pertinent past medical history.  History reviewed. No pertinent surgical history.  History reviewed. No pertinent family history.  Social History     Tobacco Use    Smoking status: Never Smoker    Smokeless tobacco: Never Used   Substance Use Topics    Alcohol use: Yes     Comment: wine       In addition to findings reviewed in EPIC, relevant Medical/Family/Social History noted in today's HPI, otherwise are noncontributory to today's visit.    Physical Exam: Pt is a well developed, well nourished 85 y.o. year  old female who is awake, alert, and oriented.  The patient has a normal affect and is in no acute distress.  Vitals:    10/09/18 1439   BP: (!) 150/96   Pulse: 80   Temp: 98.1 F (36.7 C)   SpO2: 98%       Gait: Is a normal gait but her balance is a little frail  Zickel exam the left hip demonstrates good comfortable range of motion for flexion with internal and external rotation.  She has a fairly large fatty feeling mass just behind the incision.  For comparison, their contralateral extremity is normal on exam with no pain nor crepitus.    Radiology:     Xrays taken in clinic today: X-rays demonstrate a bipolar hemiarthroplasty which appears to be well fixed.    Impression: That is post bipolar hemiarthroplasty with mild residual pain  And fatty fullness anterior to the incision  Plan: The best option would be observation at this point.  She could be referred to another plastic surgeon here while she is in [place] for the summer to remove the mass but I do not think it is really bothering her that much cosmetically and  to do anything about the hip pain would require revision and conversion of the hemiarthroplasty to a total hip which is a fairly large operation for her to take at this point in her life.  We talked about the situation at some length and I think we will just observe for now and see how things go with a recheck with me on an as-needed basis

## 2019-04-04 IMAGING — MG MAMMOGRAPHY SCREENING BILATERAL 3D TOMOSYNTHESIS WITH CAD
8 series · 8 of 24 positions shown · non-contrast
Comparison: Comparison was made to prior exams.

MAMMOGRAPHY SCREENING BILATERAL 3D TOMOSYNTHESIS WITH CAD, 04/04/2019 [DATE]: 
CLINICAL INDICATION: Screening exam.
TECHNIQUE: Digital bilateral mammograms and 3-D Tomosynthesis were obtained. 
These were interpreted both primarily and with the aid of computer-aided 
detection system.

[L CC]
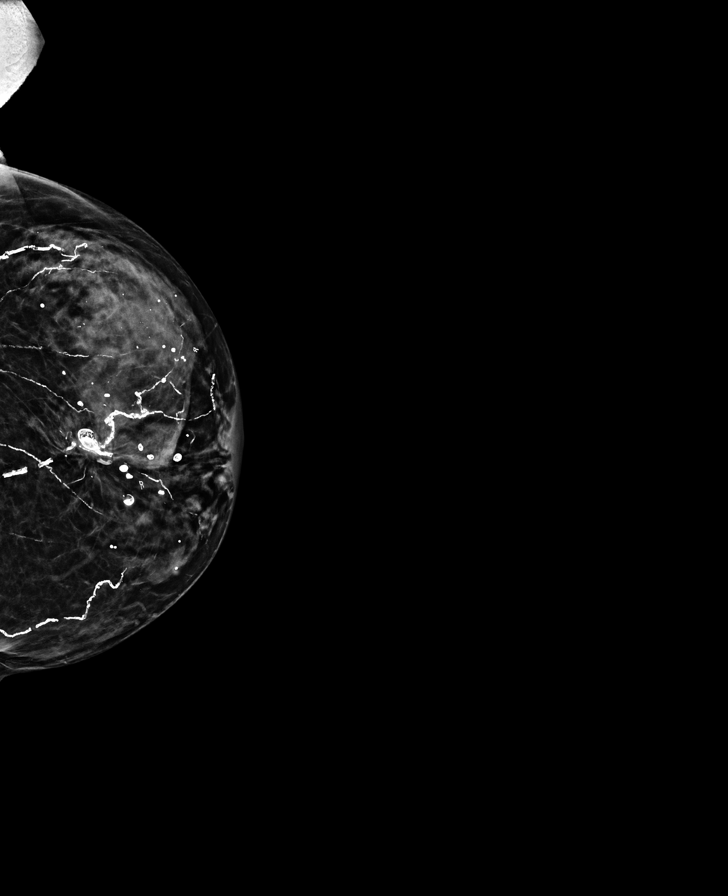

[L MLO]
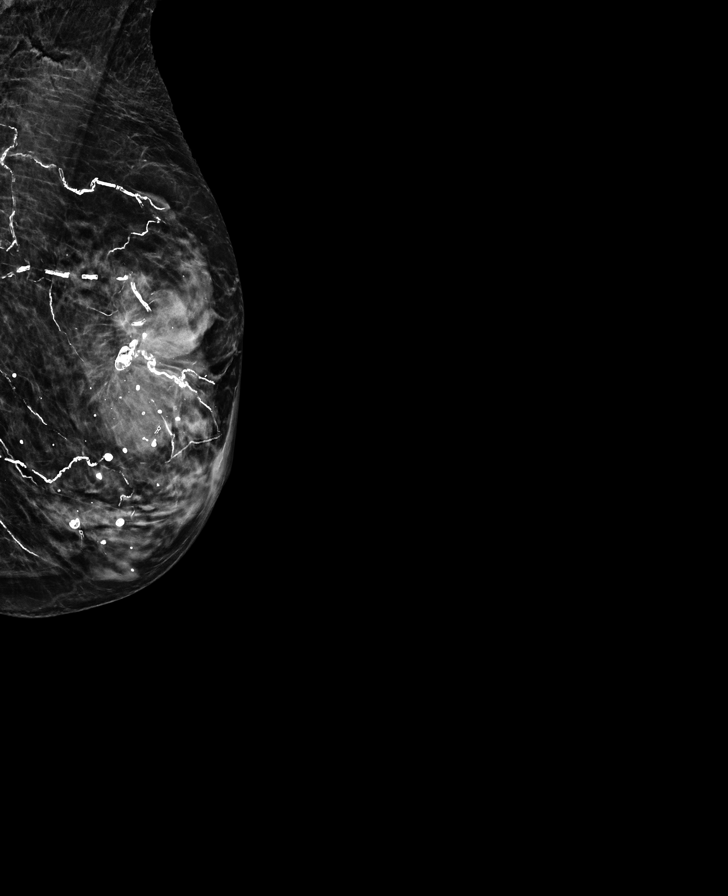

[R CC]
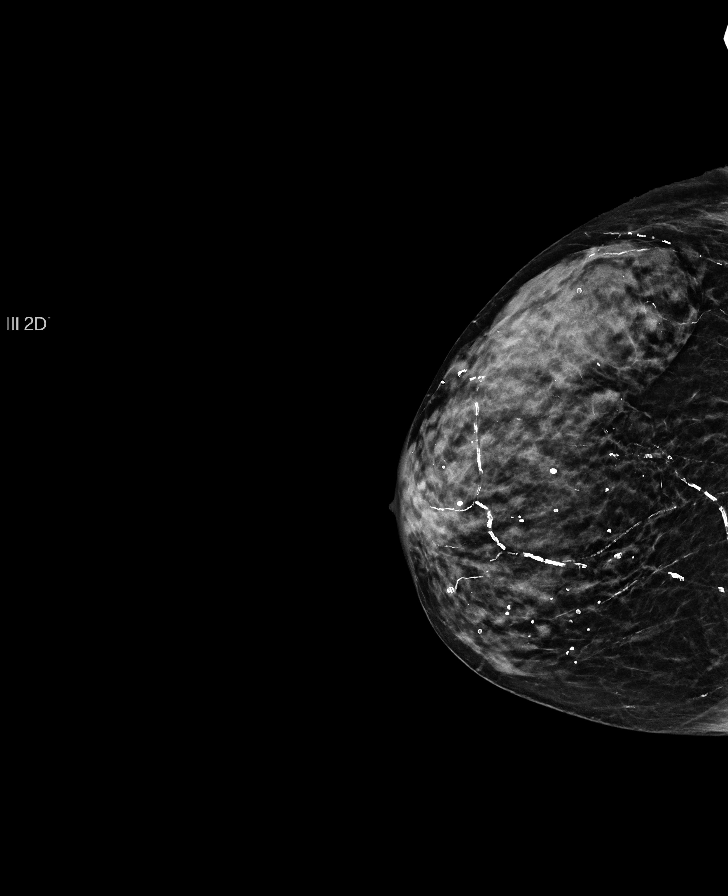

[R MLO]
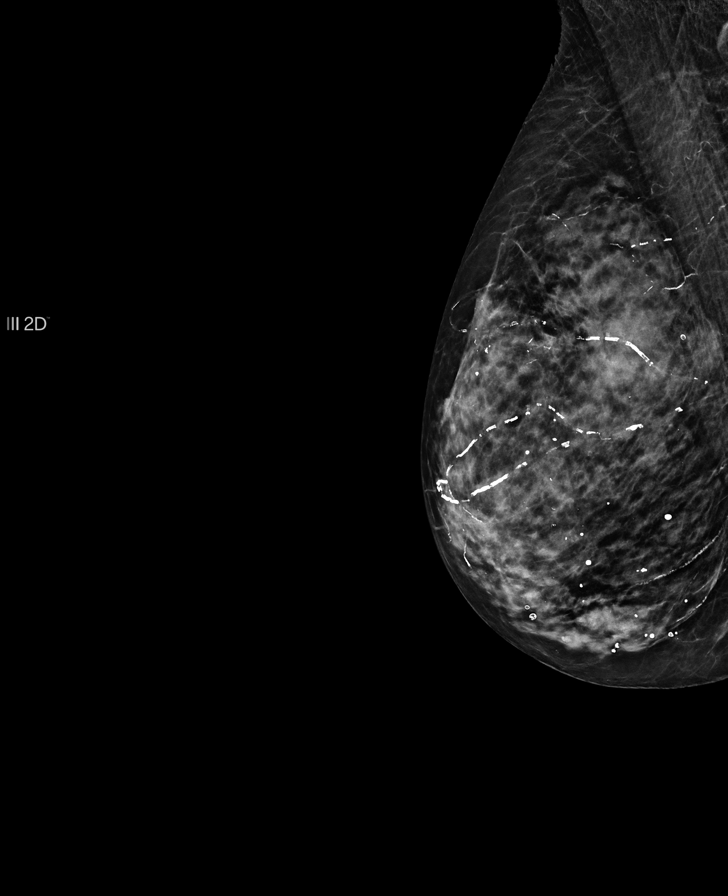

[L CC tomo · tomo slice 26/51.0]
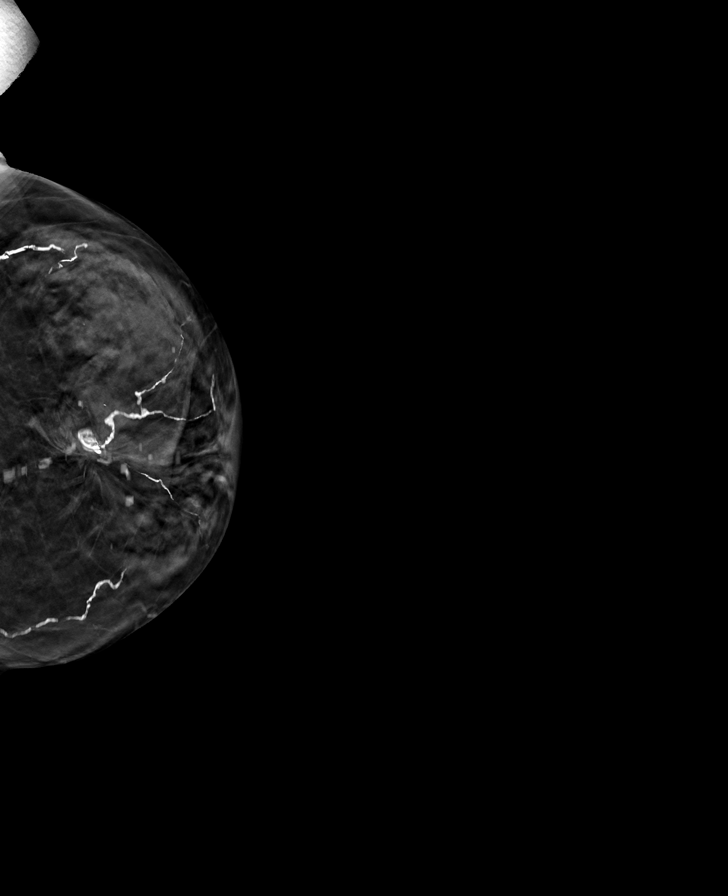

[R MLO tomo · tomo slice 21/42.0]
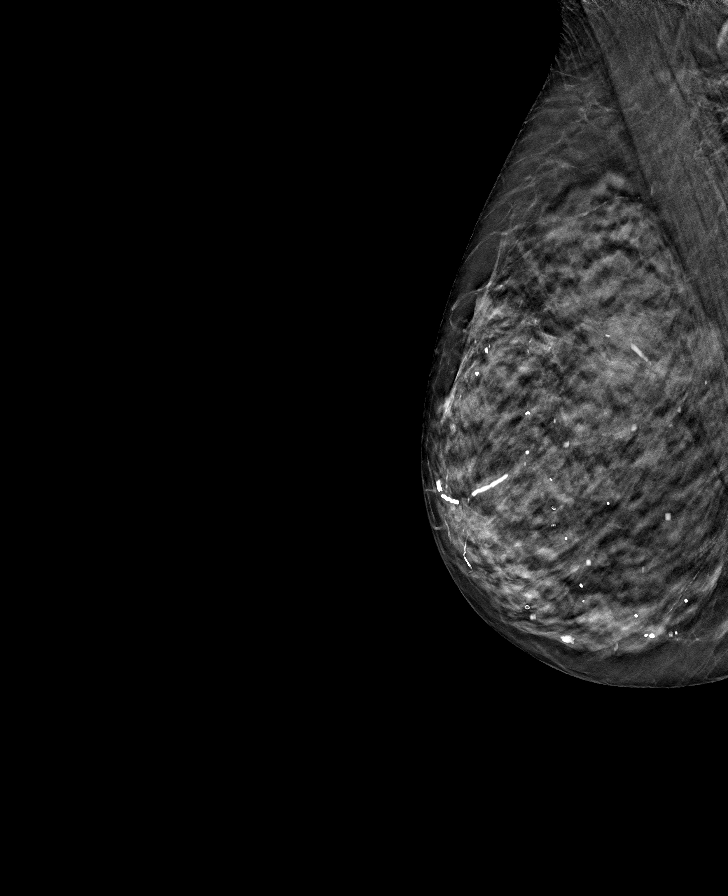

[L MLO tomo · tomo slice 18/35.0]
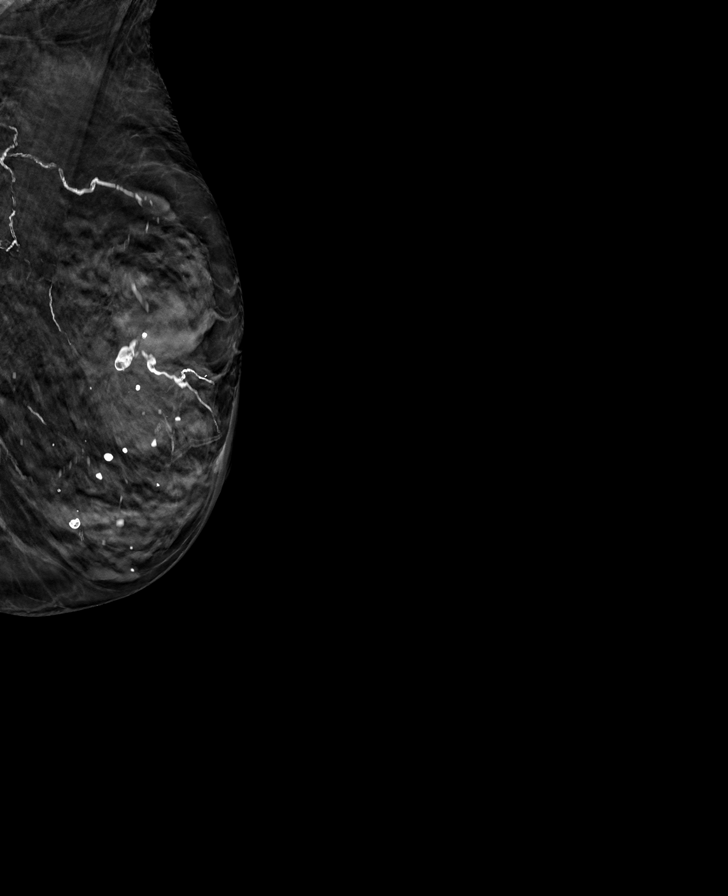

[R CC tomo · tomo slice 21/42.0]
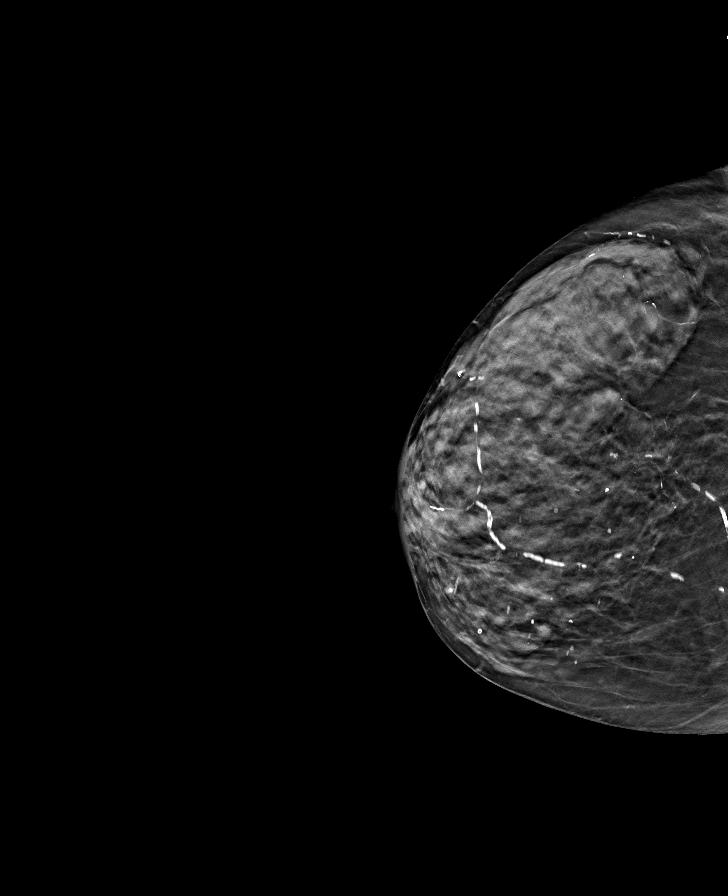

[8 of 24 positions shown; findings below may reference images not displayed]

BREAST DENSITY: (Level C) The breasts are heterogeneously dense, which may 
obscure small masses.
FINDINGS: Post colectomy changes involving the left breast appear stable to 
prior examination. The procedure clip in the inferior left breast. No suspicious 
mass, calcifications, or area of architectural distortion in either breast.
IMPRESSION: No mammographic evidence of malignancy in either breast. 
( BI-RADS 2) Benign findings. Routine mammographic follow-up is recommended.

## 2019-04-23 DIAGNOSIS — N183 Chronic kidney disease, stage 3 unspecified: Secondary | ICD-10-CM | POA: Insufficient documentation

## 2019-04-23 DIAGNOSIS — I10 Essential (primary) hypertension: Secondary | ICD-10-CM | POA: Insufficient documentation

## 2019-04-23 DIAGNOSIS — K219 Gastro-esophageal reflux disease without esophagitis: Secondary | ICD-10-CM | POA: Insufficient documentation

## 2019-10-10 ENCOUNTER — Ambulatory Visit (INDEPENDENT_AMBULATORY_CARE_PROVIDER_SITE_OTHER): Payer: Medicare Other | Admitting: Family

## 2019-10-10 ENCOUNTER — Encounter (INDEPENDENT_AMBULATORY_CARE_PROVIDER_SITE_OTHER): Payer: Self-pay | Admitting: Family

## 2019-10-10 VITALS — BP 118/71 | HR 65 | Temp 97.6°F | Resp 18 | Ht 60.0 in | Wt 109.0 lb

## 2019-10-10 DIAGNOSIS — R82998 Other abnormal findings in urine: Secondary | ICD-10-CM

## 2019-10-10 DIAGNOSIS — R300 Dysuria: Secondary | ICD-10-CM

## 2019-10-10 DIAGNOSIS — N3001 Acute cystitis with hematuria: Secondary | ICD-10-CM

## 2019-10-10 LAB — POCT URINALYSIS AUTOMATED (IAH)
Bilirubin, UA POCT: NEGATIVE
Glucose, UA POCT: NEGATIVE
Ketones, UA POCT: NEGATIVE mg/dL
Nitrite, UA POCT: NEGATIVE
PH, UA POCT: 6 (ref 4.6–8)
Protein, UA POCT: NEGATIVE mg/dL
Specific Gravity, UA POCT: 1.015 mg/dL (ref 1.001–1.035)
Urobilinogen, UA POCT: 0.2 mg/dL

## 2019-10-10 MED ORDER — SULFAMETHOXAZOLE-TRIMETHOPRIM 800-160 MG PO TABS
1.0000 | ORAL_TABLET | Freq: Two times a day (BID) | ORAL | 0 refills | Status: AC
Start: 2019-10-10 — End: 2019-10-13

## 2019-10-10 MED ORDER — PHENAZOPYRIDINE HCL 200 MG PO TABS
200.0000 mg | ORAL_TABLET | Freq: Three times a day (TID) | ORAL | 0 refills | Status: AC | PRN
Start: 2019-10-10 — End: 2019-10-14

## 2019-10-10 NOTE — Progress Notes (Signed)
[facility]  PROGRESS NOTE     Patient: Alexandra Graves   Date: 10/10/2019   MRN: [id number]       KEYONIA GLUTH is a 86 y.o. female      HISTORY     History obtained from: Patient    Chief Complaint   Patient presents with    Urinary Tract Infection Symptoms     started 3 days ago with frequent urination and burning sensation while urinating. Pt has not taken anything for symptoms.          Reports x3 days of urinary freq and mild burning. Denies fever/chills/low back pain. +suprapubic pressure.            Review of Systems   Constitutional: Negative.    Respiratory: Negative.    Cardiovascular: Negative.    Gastrointestinal: Positive for abdominal pain (suprapubic discomfort).   Genitourinary: Positive for dysuria, frequency and urgency.   Neurological: Negative.    Psychiatric/Behavioral: Negative.        History:    Pertinent Past Medical, Surgical, Family and Social History were reviewed.        Current Outpatient Medications:     Bystolic 5 MG tablet, , Disp: , Rfl:     losartan-hydrochlorothiazide (HYZAAR) 100-25 MG per tablet, , Disp: , Rfl:     losartan-hydrochlorothiazide (HYZAAR) 100-25 MG per tablet, , Disp: , Rfl:     omeprazole (PriLOSEC) 40 MG capsule, , Disp: , Rfl:     propafenone (RYTHMOL) 150 MG tablet, , Disp: , Rfl:     spironolactone (ALDACTONE) 25 MG tablet, , Disp: , Rfl:     ciclopirox (PENLAC) 8 % solution, , Disp: , Rfl:     No Known Allergies    Medications and Allergies reviewed.    PHYSICAL EXAM     Vitals:    10/10/19 1024   BP: 118/71   BP Site: Left arm   Patient Position: Sitting   Pulse: 65   Resp: 18   Temp: 97.6 F (36.4 C)   TempSrc: Oral   SpO2: 99%   Weight: 49.4 kg (109 lb)   Height: 1.524 m (5')       Physical Exam  Constitutional:       General: She is not in acute distress.     Appearance: Normal appearance. She is well-developed.   HENT:      Head: Normocephalic and atraumatic.     Eyes: Conjunctivae are normal. No scleral icterus. Cardiovascular:       Rate and Rhythm: Normal rate.   Pulmonary:      Effort: Pulmonary effort is normal.   Abdominal:      General: Bowel sounds are normal. There is no distension.      Palpations: Abdomen is soft.      Tenderness: There is abdominal tenderness in the suprapubic area. There is no right CVA tenderness or left CVA tenderness.   Neurological:      Mental Status: She is alert and oriented to person, place, and time.   Skin:     General: Skin is warm.      Coloration: Skin is not pale.   Vitals and nursing note reviewed.         UCC COURSE     LABS  The following POCT tests were ordered, reviewed and discussed with the patient/family.     Results     Procedure Component Value Units Date/Time    UA Clinitek (  urine dipstick) [[id number]]  (Abnormal) Collected: 10/10/19 1039     Updated: 10/10/19 1040     Urine Color POCT Yellow     Urine Clarity POCT Clear     Glucose, UA POCT Negative     Bilirubin, UA POCT Negative     Ketones, UA POCT Negative mg/dL      Specific Gravity, UA POCT 1.015 mg/dL      Blood, UA POCT  Trace - intact     PH, UA POCT 6.0     Protein, UA POCT Negative mg/dL      Urobilinogen, UA POCT 0.2 mg/dL      Nitrite, UA POCT Negative     Urine Leukocytes POCT Small          X-Ray  The following X-ray studies were ordered, visualized and independently interpreted by me. Results were discussed with the patient/family.     No results found.    No current facility-administered medications for this visit.       PROCEDURES     Procedures    MEDICAL DECISION MAKING     History, physical, labs/studies most consistent with acute cystitis as the diagnosis.    Chart Review:  Prior PCP, Specialist and/or ED notes reviewed today: Not Applicable  Prior labs/images/studies reviewed today: Not Applicable    Differential Diagnosis: cystitis, pyelo,       ASSESSMENT     Encounter Diagnosis   Name Primary?    Dysuria Yes            PLAN      PLAN:      Findings consistent with urinary tract infection.   UA with leuks, blood;  urine cx sent   Bactrim rx'd, macrobid on BEERS list   Increase PO fluids to maintain adequate hydration   Avoid hygiene sprays,vaginal douching, caffeine, tobacco products or nicotine   Void post-coital to avoid subsequent infections   Wipe front to back after voiding   Report to ED if any flank pain, abd pain, nausea/vomiting, fevers >100.4, or any concerning/worsening symptoms   Patient verbalized understanding of information and had no further questions/concerns        Orders Placed This Encounter   Procedures    UA Clinitek (urine dipstick)     Requested Prescriptions      No prescriptions requested or ordered in this encounter       Discussed results and diagnosis with patient/family.  Reviewed warning signs for worsening condition, as well as, indications for follow-up with primary care physician and return to urgent care clinic.   Patient/family expressed understanding of instructions.     An After Visit Summary was provided to the patient.

## 2019-10-10 NOTE — Patient Instructions (Signed)
Bladder Infection, Female (Adult)     Urine normally doesn't have any germs (bacteria) in it. But bacteria can get into the urinary tract from the skin around the rectum. Or they can travel in the blood from other parts of the body. Once they are in your urinary tract, they can cause infection in these areas:  · The urethra (urethritis)  · The bladder (cystitis)  · The kidneys (pyelonephritis)  The most common place for an infection is in the bladder. This is called a bladder infection. This is one of the most common infections in women. Most bladder infections are easily treated. They are not serious unless the infection spreads to the kidney.  The terms bladder infection, UTI, and cystitis are often used to describe the same thing. But they are not always the same. Cystitis is an inflammation of the bladder. The most common cause of cystitis is an infection.  Symptoms  The infection causes inflammation in the urethra and bladder. This causes many of the symptoms. The most common symptoms of a bladder infection are:  · Pain or burning when urinating  · Having to urinate more often than normal  · Urgent need to urinate  · Only a small amount of urine comes out  · Blood in urine  · Belly (abdominal) discomfort. This is often in the lower belly above the pubic bone.  · Cloudy urine  · Strong- or bad-smelling urine  · Unable to urinate (urinary retention)  · Unable to hold urine in (urinary incontinence)  · Fever  · Loss of appetite  · Confusion (in older adults)  Causes  Bladder infections are not contagious. You can't get one from someone else, from a toilet seat, or from sharing a bath.  The most common cause of bladder infections is bacteria from the bowels. The bacteria get onto the skin around the opening of the urethra. From there, they can get into the urine. Then they travel up to the bladder, causing inflammation and infection. This often happens because of:  · Wiping incorrectly after urinating. Always  wipe from front to back.  · Bowel incontinence  · Pregnancy  · Procedures such as having a catheter put in  · Older age  · Not emptying your bladder. This can give bacteria a chance to grow in your urine.  · Fluid loss (dehydration)  · Constipation  · Having sex  · Using a diaphragm for birth control   Treatment  Bladder infections are diagnosed by a urine test and urine culture. They are treated with antibiotics. They often clear up quickly without problems. Treatment helps prevent a more serious kidney infection.  Medicines  Medicines can help in the treatment of a bladder infection:  · Take antibiotics until they are used up, even if you feel better. It's important to finish them to make sure the infection has cleared.  · You can use acetaminophen or ibuprofen for pain, fever, or discomfort, unless another medicine was prescribed. If you have long-term (chronic) liver or kidney disease, talk with your healthcare provider before using these medicines. Also talk with your provider if you've ever had a stomach ulcer or GI (gastrointestinal) bleeding, or are taking blood-thinner medicines.  · If you are given phenazopydridine to reduce burning with urination, it will make your urine a bright orange color. This can stain clothing.  Care and prevention  These self-care steps can help prevent future infections:  · Drink plenty of fluids. This helps to prevent dehydration and flush out your bladder. Do this unless   you must restrict fluids for other health reasons, or your healthcare provider told you not to.  · Clean yourself correctly after going to the bathroom. Wipe from front to back after using the toilet. This helps prevent the spread of bacteria.  · Urinate more often. Don't try to hold urine in for a long time.  · Wear loose-fitting clothes and cotton underwear. Don't wear tight-fitting pants.  · Improve your diet and prevent constipation. Eat more fresh fruits and vegetables, and fiber. Eat less junk foods and  fatty foods.  · Don't have sex until your symptoms are gone.  · Don't have caffeine, alcohol, and spicy foods. These can irritate your bladder.  · Urinate right after you have sex to flush out your bladder.  · If you use birth control pills and have frequent bladder infections, discuss it with your healthcare provider.  Follow-up care  Call your healthcare provider if all symptoms are not gone after 3 days of treatment. This is especially important if you have repeat infections.  If a culture was done, you will be told if your treatment needs to be changed. If directed, you can call to find out the results.  If X-rays were done, you will be told if the results will affect your treatment.  Call 911  Call 911 if any of the following occur:  · Trouble breathing  · Hard to wake up or confusion  · Fainting (loss of consciousness)  · Fast heart rate  When to get medical advice  Call your healthcare provider right away if any of these occur:  · Fever of 100.4ºF (38.0ºC) or higher, or as directed by your healthcare provider  · Symptoms are not better after 3 days of treatment  · Back or belly pain that gets worse  · Repeated vomiting, or unable to keep medicine down  · Weakness or dizziness  · Vaginal discharge  · Pain, redness, or swelling in the outer vaginal area (labia)  [employer] last reviewed this educational content on 12/07/2017    © 2000-2021 [employer]. All rights reserved. This information is not intended as a substitute for professional medical care. Always follow your healthcare professional's instructions.

## 2019-10-16 LAB — URINE CULTURE

## 2019-10-16 MED ORDER — CIPROFLOXACIN HCL 500 MG PO TABS
500.0000 mg | ORAL_TABLET | Freq: Two times a day (BID) | ORAL | 0 refills | Status: AC
Start: 2019-10-16 — End: 2019-10-19

## 2019-10-16 NOTE — Addendum Note (Signed)
Addended by: Patsi Sears on: 10/16/2019 03:40 PM     Modules accepted: Orders

## 2019-10-17 ENCOUNTER — Telehealth (INDEPENDENT_AMBULATORY_CARE_PROVIDER_SITE_OTHER): Payer: Self-pay

## 2019-10-17 NOTE — Telephone Encounter (Signed)
Pt called for lab results and prescription instructions.

## 2020-04-05 IMAGING — MG MAMMOGRAPHY SCREENING BILATERAL 3D TOMOSYNTHESIS WITH CAD
7 series · 9 of 19 positions shown · non-contrast
Comparison: Comparison was made to prior examinations.

MAMMOGRAPHY SCREENING BILATERAL 3D TOMOSYNTHESIS WITH CAD, 04/05/2020 [DATE]: 
CLINICAL INDICATION: Screening exam.
TECHNIQUE: Digital bilateral mammograms and 3-D Tomosynthesis were obtained. 
These were interpreted both primarily and with the aid of computer-aided 
detection system. 
BREAST DENSITY: (Level C) The breasts are heterogeneously dense, which may 
obscure small masses.

[R CC]
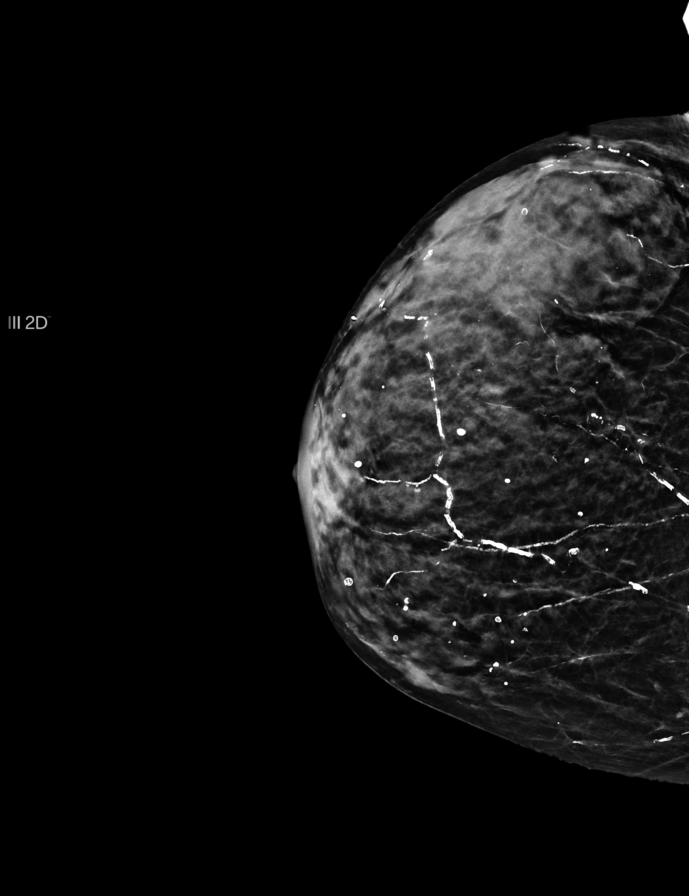

[L CC]
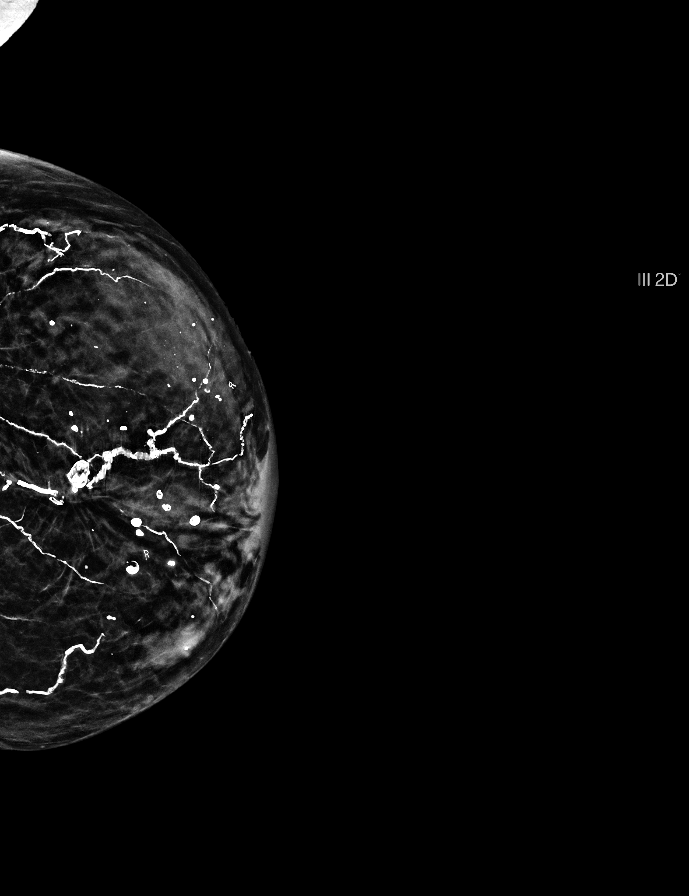

[L MLO]
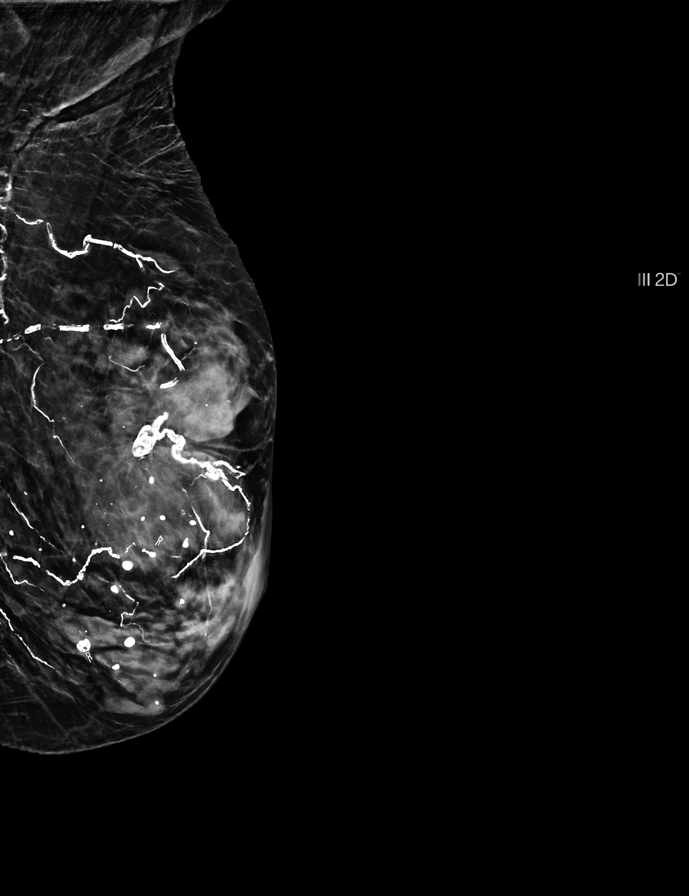

[R MLO]
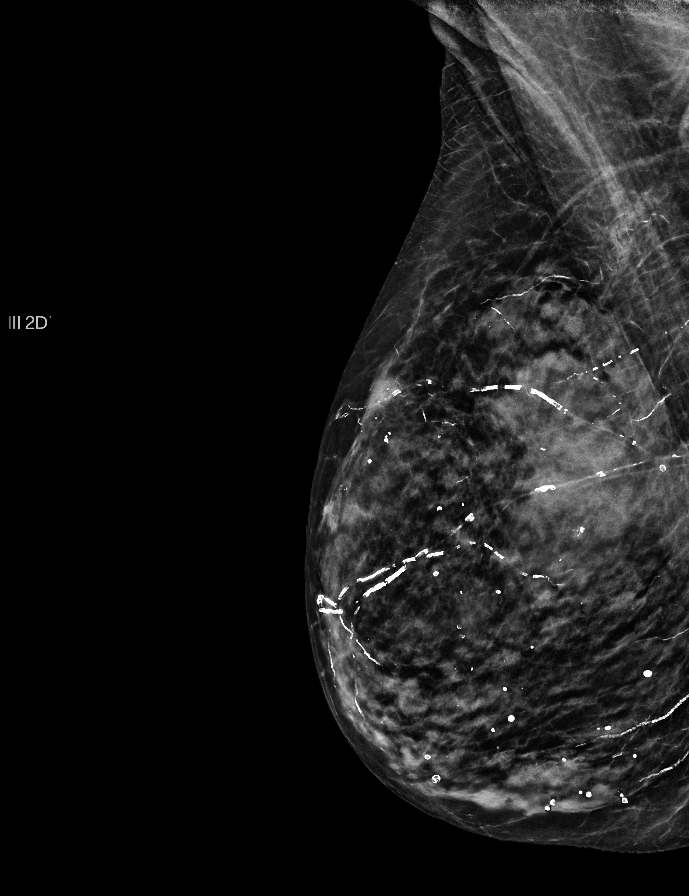

[L MLO tomo · 3 of 46 frames shown]
[frame 15/46]
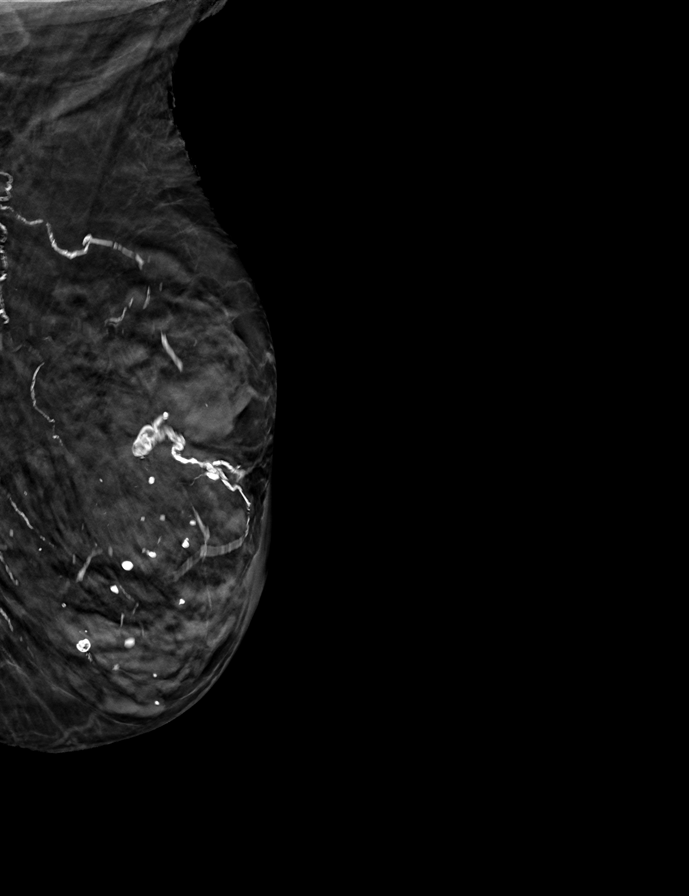
[frame 23/46]
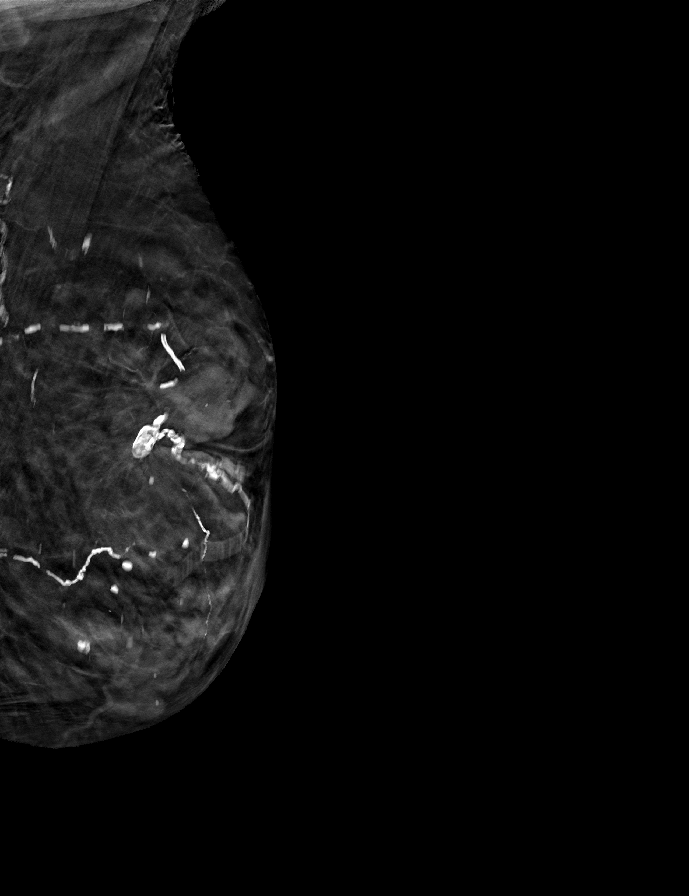
[frame 32/46]
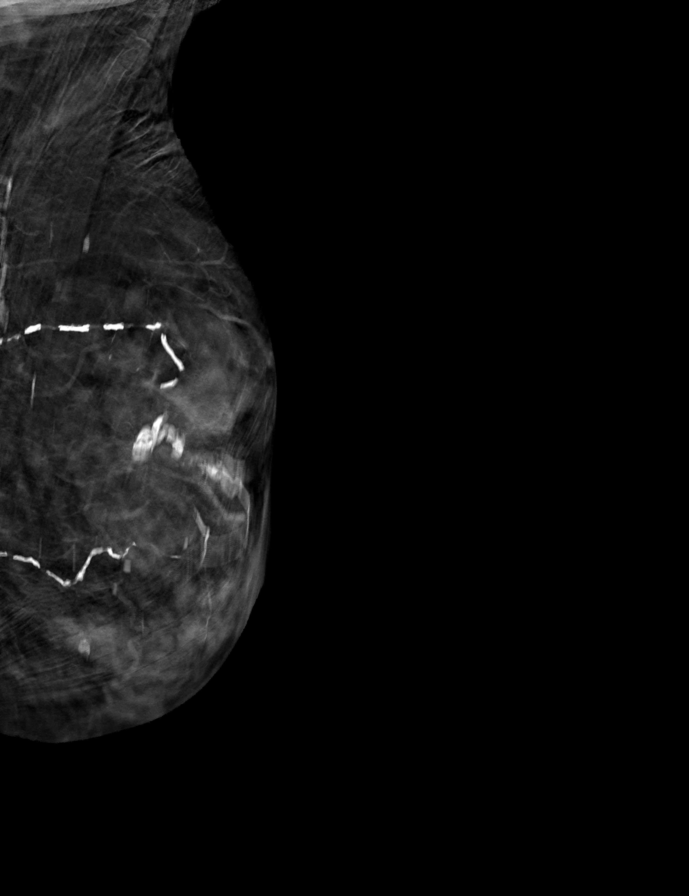

[L CC tomo · tomo slice 20/39.0]
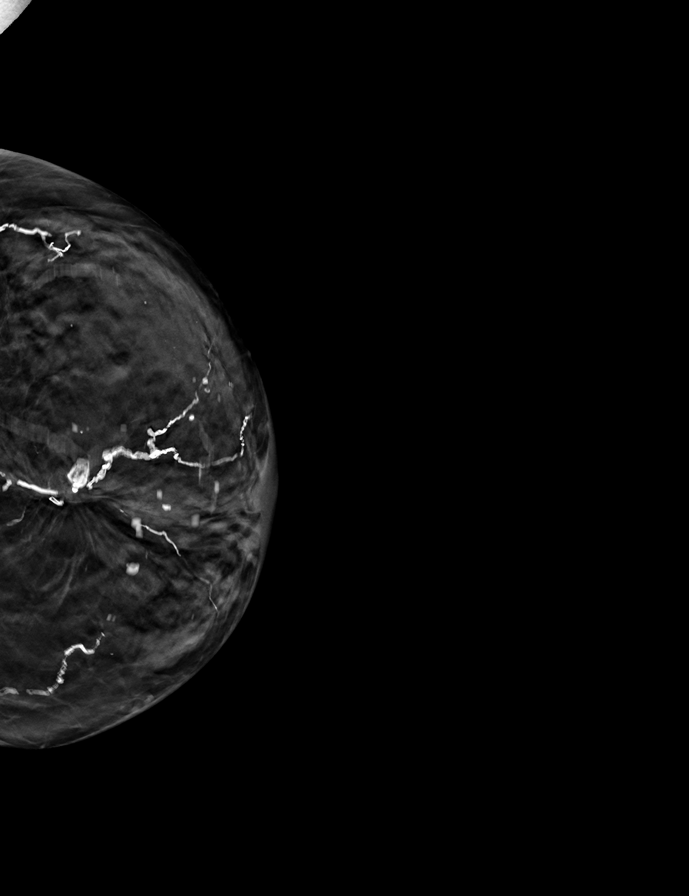

[R CC tomo · tomo slice 19/37.0]
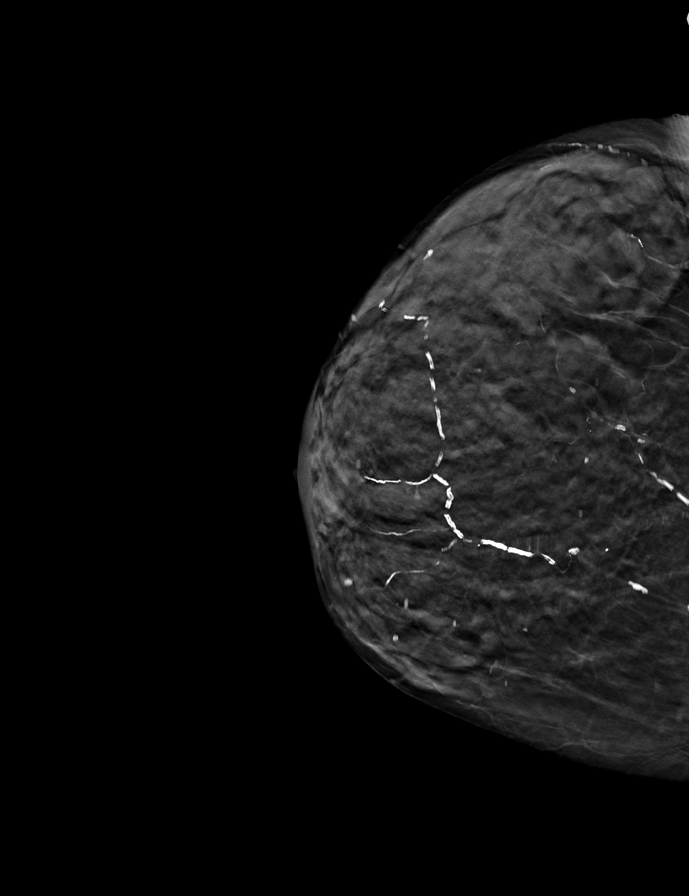

[9 of 19 positions shown; findings below may reference images not displayed]

FINDINGS: Stable appearing lumpectomy changes involving the left breast. No 
suspicious mass, calcifications, or area of architectural distortion in either 
breast.
IMPRESSION: No mammographic evidence of malignancy in either breast. 
( BI-RADS 2) Benign findings. Routine mammographic follow-up is recommended.

## 2021-04-06 IMAGING — MG MAMMOGRAPHY SCREENING BILATERAL 3[PERSON_NAME]
8 series · 9 of 24 positions shown · non-contrast
Comparison: Comparison was made to prior examinations.

________________________________________________________________________________________________ 
MAMMOGRAPHY SCREENING BILATERAL 3SHAYLA JUMPER, 04/06/2021 [DATE]: 
CLINICAL INDICATION: Screening. History of LEFT lumpectomy 8115.
TECHNIQUE: Digital bilateral mammograms and 3-D Tomosynthesis were obtained. 
These were interpreted both primarily and with the aid of computer-aided 
detection system.  
BREAST DENSITY: (Level D) The breasts are extremely dense, which lowers the 
sensitivity of mammography.

[L CC]
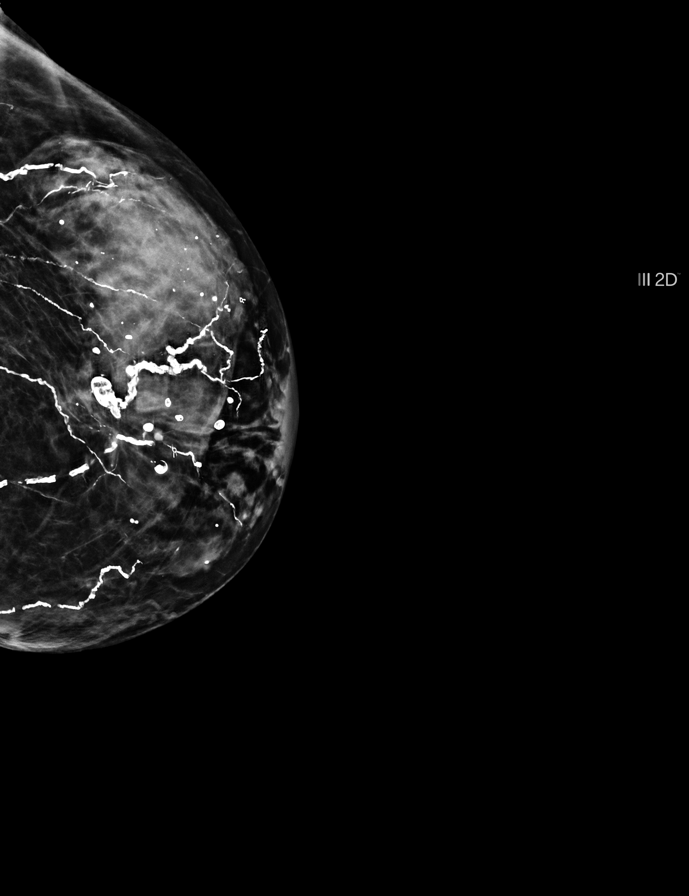

[L MLO]
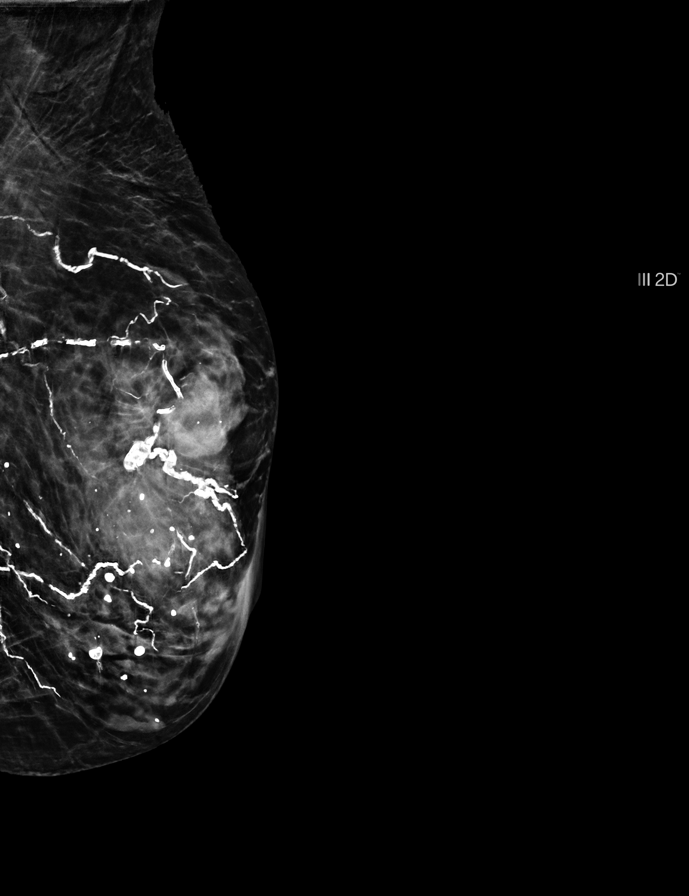

[R MLO]
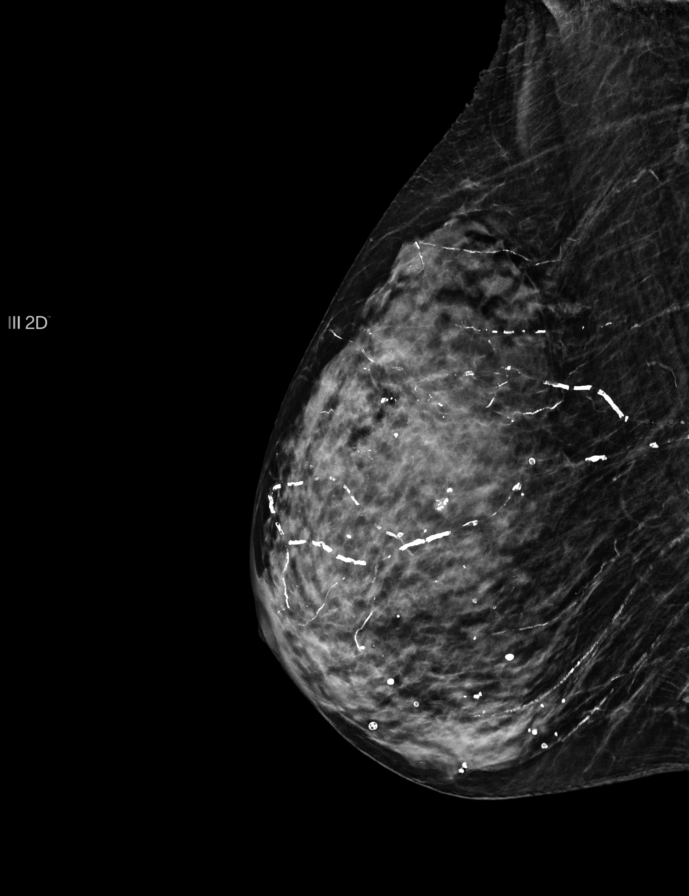

[R CC]
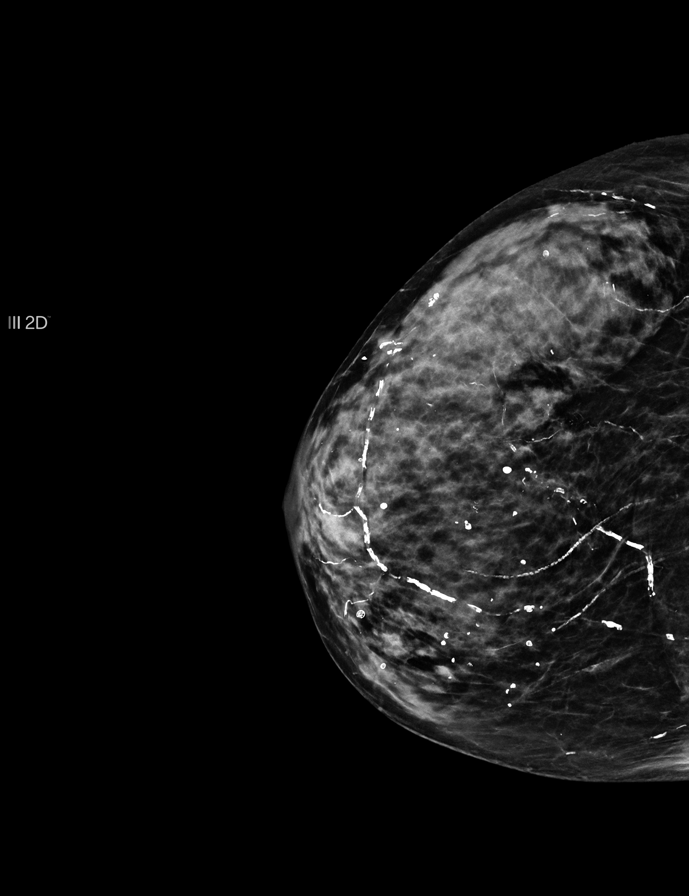

[L MLO tomo · 2 of 48 frames shown]
[frame 16/48]
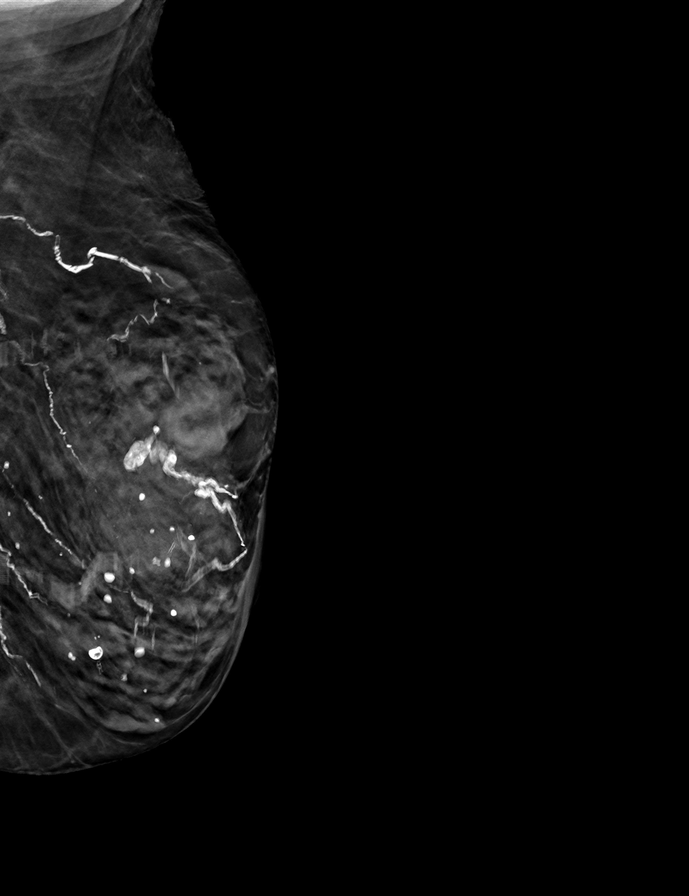
[frame 25/48]
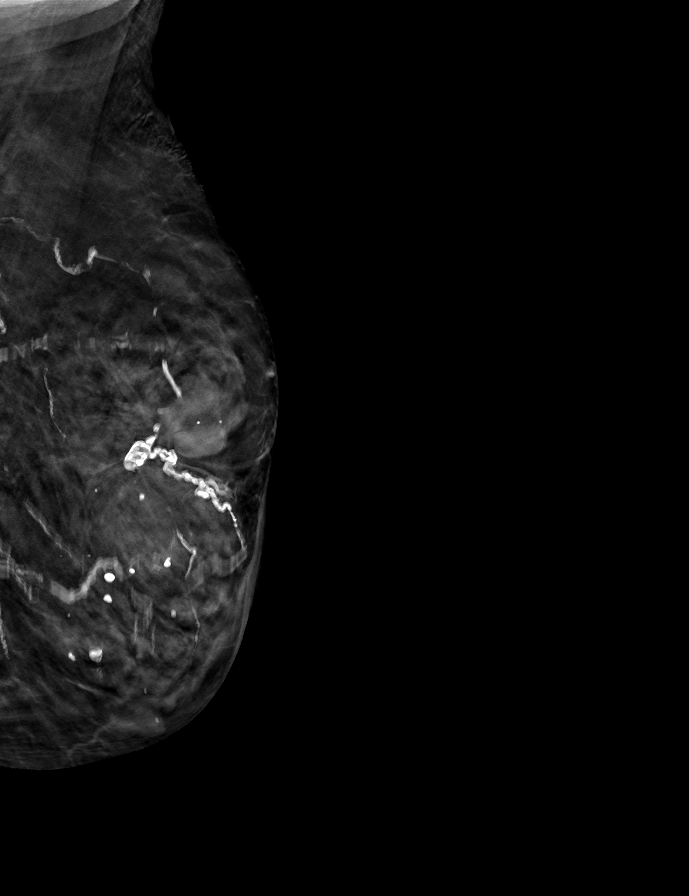

[L CC tomo · tomo slice 27/54.0]
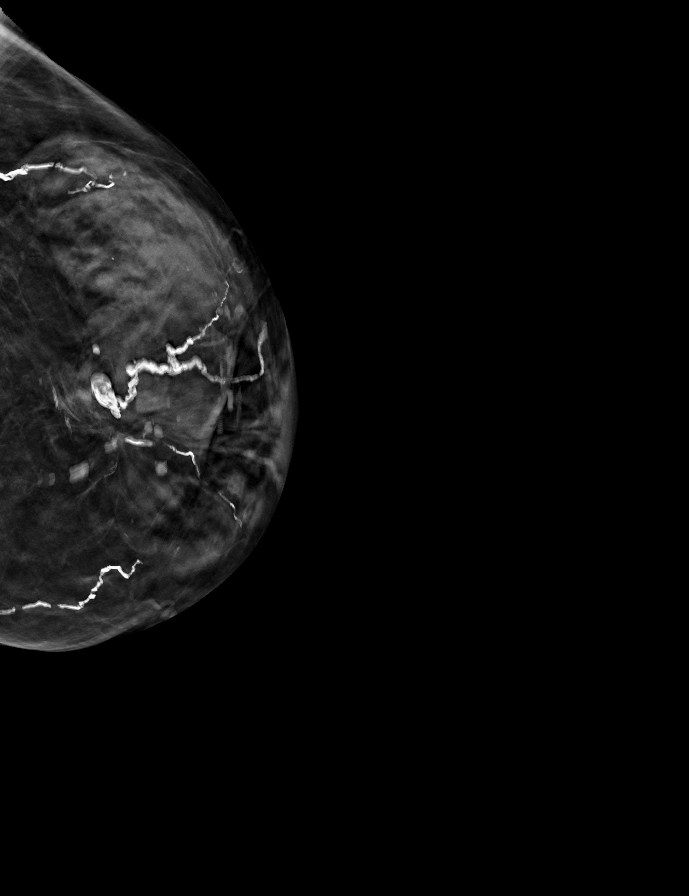

[R MLO tomo · tomo slice 22/43.0]
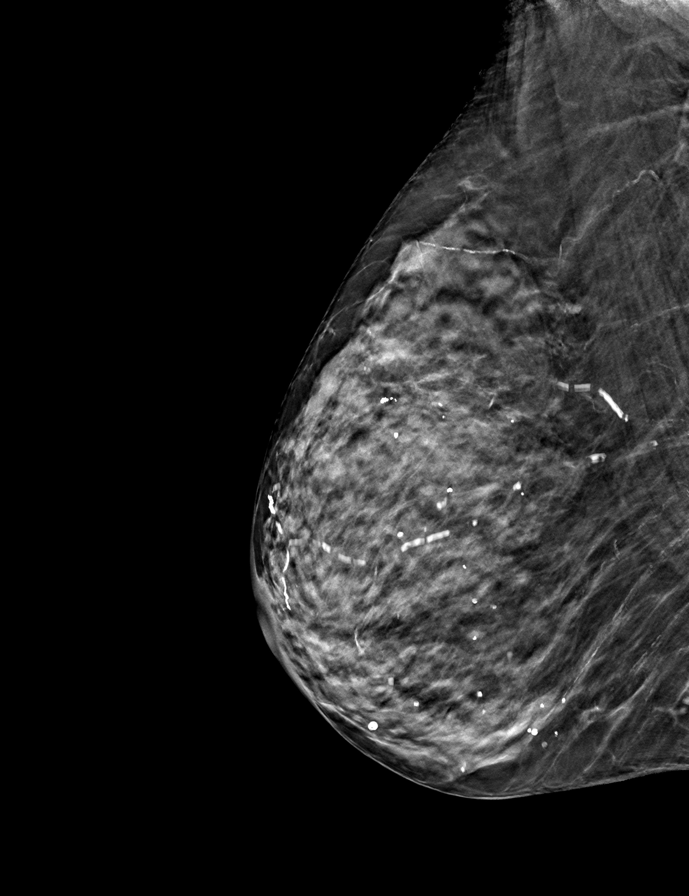

[R CC tomo · tomo slice 22/43.0]
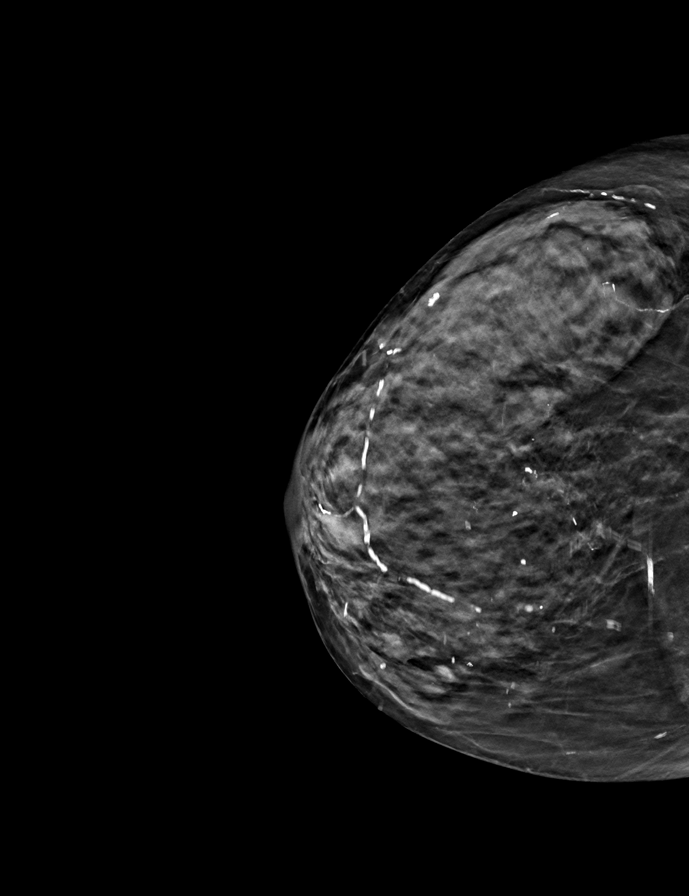

[9 of 24 positions shown; findings below may reference images not displayed]

FINDINGS: Post lumpectomy changes are in the LEFT breast. Prominent benign calcifications 
are present bilaterally including pronounced arterial calcifications. No 
mammographically suspicious abnormality and no significant change from prior 
mammograms.
IMPRESSION: (BI-RADS 2) Benign findings. Routine mammographic follow-up is recommended.

## 2021-06-01 ENCOUNTER — Ambulatory Visit (INDEPENDENT_AMBULATORY_CARE_PROVIDER_SITE_OTHER): Payer: Medicare Other | Admitting: Family Medicine

## 2021-06-01 ENCOUNTER — Encounter (INDEPENDENT_AMBULATORY_CARE_PROVIDER_SITE_OTHER): Payer: Self-pay | Admitting: Family Medicine

## 2021-06-01 VITALS — BP 95/60 | HR 58 | Temp 98.1°F | Resp 18 | Wt 113.0 lb

## 2021-06-01 DIAGNOSIS — J019 Acute sinusitis, unspecified: Secondary | ICD-10-CM

## 2021-06-01 DIAGNOSIS — R197 Diarrhea, unspecified: Secondary | ICD-10-CM

## 2021-06-01 MED ORDER — DOXYCYCLINE HYCLATE 100 MG PO CAPS
100.0000 mg | ORAL_CAPSULE | Freq: Two times a day (BID) | ORAL | 0 refills | Status: AC
Start: 2021-06-01 — End: 2021-06-08

## 2021-06-01 NOTE — Progress Notes (Signed)
[facility]  Progress Note    Patient: Alexandra Graves   Date: 06/01/2021   MRN: [id number]       Alexandra Graves is a 88 y.o. female      HISTORY     History obtained from: Patient    Chief Complaint:   Chief Complaint   Patient presents with    Diarrhea     Acute onset of diarrhea, nausea, fatigue, and yellowish nasal discharge w/ suspected sinus infection for 2 weeks. Denies F/C, recent changes to diet, myalgia, sore throat, cough. Self tx includes Imodium AD and Tylenol w/ no relief, the latter last taken this morning. 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        History:    Patient presents with diarrhea, nausea, fatigue, yellow nasal discharge and sinus pressure and pain for over two weeks. Recent travel from [place] but had symptoms there. Denies recent exposure to a healthcare setting. Denies recent antibiotic use. Typically used to get diarrhea once a week. Diarrhea is soft to wet. Using imodium without improvement.       Pertinent Past Medical, Surgical, Family and Social History were reviewed.      Review of Systems As noted above. Otherwise negative.         Current Outpatient Medications:     amLODIPine (NORVASC) 5 MG tablet, , Disp: , Rfl:     aspirin EC 81 MG EC tablet, Take 81 mg by mouth daily, Disp: , Rfl:     Biotin 1000 MCG Chew Tab, Chew by mouth, Disp: , Rfl:     Bystolic 5 MG tablet, , Disp: , Rfl:     Denosumab (PROLIA SC), Inject into the skin, Disp: , Rfl:     losartan-hydrochlorothiazide (HYZAAR) 100-25 MG per tablet, , Disp: , Rfl:     omeprazole (PriLOSEC) 40 MG capsule, , Disp: , Rfl:     propafenone (RYTHMOL) 150 MG tablet, , Disp: , Rfl:     spironolactone (ALDACTONE) 25 MG tablet, , Disp: , Rfl:     ciclopirox (PENLAC) 8 % solution, , Disp: , Rfl:     doxycycline (VIBRAMYCIN) 100 MG capsule, Take 1 capsule (100 mg) by mouth 2 (two) times daily for 7 days, Disp: 14 capsule, Rfl: 0    losartan-hydrochlorothiazide (HYZAAR) 100-25 MG per  tablet, , Disp: , Rfl:     No Known Allergies    Medications and Allergies reviewed.    PHYSICAL EXAM     Vitals:    06/01/21 1130   BP: 95/60   Pulse: (!) 58   Resp: 18   Temp: 98.1 F (36.7 C)   TempSrc: Oral   SpO2: 98%   Weight: 51.3 kg (113 lb)       Physical Exam  Constitutional:       General: She is not in acute distress.     Appearance: Normal appearance. She is well-developed.   HENT:      Head: Normocephalic and atraumatic.      Nose: Nose normal.      Mouth/Throat:      Mouth: Mucous membranes are moist.      Pharynx: No oropharyngeal exudate.     Eyes: Conjunctivae and EOM are normal. Pupils are equal, round, and reactive to light. No scleral icterus. Neck:      Thyroid: No thyroid mass or thyromegaly.      Vascular: No carotid bruit.   Cardiovascular:  Rate and Rhythm: Normal rate and regular rhythm.      Pulses: Normal pulses.      Heart sounds: Normal heart sounds. No murmur heard.  Pulmonary:      Effort: Pulmonary effort is normal. No respiratory distress.      Breath sounds: Normal breath sounds. No wheezing.   Abdominal:      General: Bowel sounds are normal. There is no distension.      Palpations: Abdomen is soft. There is no mass.      Tenderness: There is no abdominal tenderness.   Musculoskeletal:         General: Normal range of motion.      Cervical back: Normal range of motion and neck supple.   Lymphadenopathy:      Cervical: No cervical adenopathy.   Neurological:      Mental Status: She is alert and oriented to person, place, and time.      Cranial Nerves: No cranial nerve deficit.      Sensory: No sensory deficit.      Gait: Gait normal.      Deep Tendon Reflexes: Reflexes are normal and symmetric.   Skin:     General: Skin is warm and dry.      Findings: No rash.   Psychiatric:         Mood and Affect: Mood normal.         Speech: Speech normal.         Behavior: Behavior normal.   Vitals and nursing note reviewed.         UCC COURSE     There were no labs reviewed with this  patient during the visit.    There were no x-rays reviewed with this patient during the visit.    No current facility-administered medications for this visit.       PROCEDURES     Procedures      Chart Review:  Prior PCP, Specialist and/or ED notes reviewed today: Yes  Prior labs/images/studies reviewed today: Yes      Assessment & Plan     Encounter Diagnoses   Name Primary?    Acute sinusitis, recurrence not specified, unspecified location Yes    Diarrhea, unspecified type               Mylah was seen today for diarrhea.    Diagnoses and all orders for this visit:    Acute sinusitis, recurrence not specified, unspecified location  Other orders  -     doxycycline (VIBRAMYCIN) 100 MG capsule; Take 1 capsule (100 mg) by mouth 2 (two) times daily for 7 days    Diarrhea, unspecified type  -     Culture, Stool, SALM/SHIGELLA/CAMPY/EHEC; Future  -     Ova and parasite examination; Future  -     Clostridium difficile toxin B PCR; Future    PRN motrin or tylenol for pain and fever  Discussed handwashing and proper disposal of tissues  Advised elevating head of bed for drainage  Encourage to use humidifier/steam shower  Saline nasal spray and/or netipot to thin and clear secretions  Increase oral fluid intake and rest  Warm fluids/broths to facilitate drainage  Honey is an effective cough suppressant    - Drink clear fluids in small amounts as you are able Clear fluids include water, ice chips, diluated fruit juice and low calorie sports drinks  - Eat bland easy-to-digest foods in small amounts as you are able. These foods  includ bananas, applesauce, rice, lean meats, toast and crackers    BP low in office - recommended increased fluid intake at home today    If worsening symptoms proceed immediately to the Emergency Department for further workup and management.          Orders Placed This Encounter   Procedures    Culture, Stool, SALM/SHIGELLA/CAMPY/EHEC    Ova and parasite examination    Clostridium difficile toxin B  PCR     Requested Prescriptions     Signed Prescriptions Disp Refills    doxycycline (VIBRAMYCIN) 100 MG capsule 14 capsule 0     Sig: Take 1 capsule (100 mg) by mouth 2 (two) times daily for 7 days       Discussed results and diagnosis with patient/family.  Reviewed warning signs for worsening condition, as well as, indications for follow-up with primary care physician and return to urgent care clinic.  Patient/family expressed understanding of instructions.     An After Visit Summary was provided to the patient.    Gwen Her, DO    06/01/2021

## 2021-06-04 ENCOUNTER — Other Ambulatory Visit (INDEPENDENT_AMBULATORY_CARE_PROVIDER_SITE_OTHER): Payer: Self-pay | Admitting: Family Medicine

## 2021-06-07 LAB — CULTURE, STOOL, SALM/SHIGELLA/CAMPY/EHEC: E coli, Shiga toxin Assay: NEGATIVE

## 2021-06-08 LAB — CLOSTRIDIUM DIFFICILE TOXIN B PCR

## 2021-06-08 LAB — CULTURE, STOOL, SALM/SHIGELLA/CAMPY/EHEC

## 2021-06-08 LAB — OVA AND PARASITE EXAMINATION

## 2021-06-09 LAB — CULTURE, STOOL, SALM/SHIGELLA/CAMPY/EHEC: E coli, Shiga toxin Assay: NEGATIVE

## 2021-06-10 NOTE — Progress Notes (Signed)
Please notify patient that their Stool Studies are negative. Follow up with PCP if having symptoms.

## 2021-07-06 ENCOUNTER — Encounter (INDEPENDENT_AMBULATORY_CARE_PROVIDER_SITE_OTHER): Payer: Medicare Other

## 2021-09-06 ENCOUNTER — Other Ambulatory Visit: Payer: Self-pay

## 2021-11-01 DIAGNOSIS — N2581 Secondary hyperparathyroidism of renal origin: Secondary | ICD-10-CM | POA: Insufficient documentation

## 2022-04-11 IMAGING — MG MAMMOGRAPHY SCREENING BILATERAL 3[PERSON_NAME]
8 series · 8 of 24 positions shown · non-contrast
Comparison: Comparison was made to prior examinations.

________________________________________________________________________________________________ 
MAMMOGRAPHY SCREENING BILATERAL 3JOSE AQUILES MERIEAU, 04/11/2022 [DATE]: 
CLINICAL INDICATION: Encounter for screening mammogram.
TECHNIQUE: Digital bilateral mammograms and 3-D Tomosynthesis were obtained. 
These were interpreted both primarily and with the aid of computer-aided 
detection system.  
BREAST DENSITY: (Level D) The breasts are extremely dense, which lowers the 
sensitivity of mammography.

[L MLO]
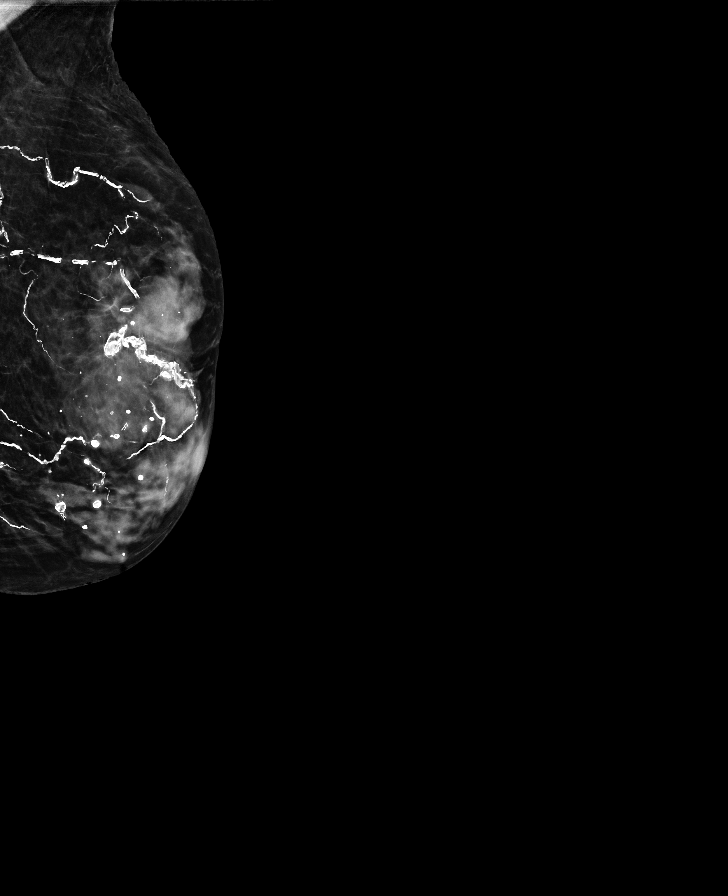

[R MLO]
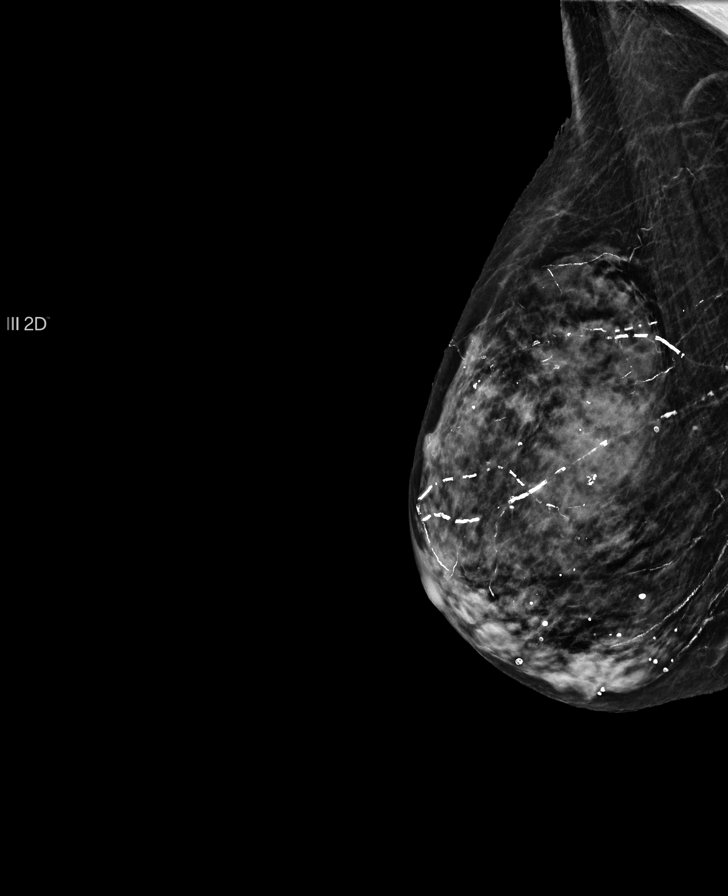

[R CC]
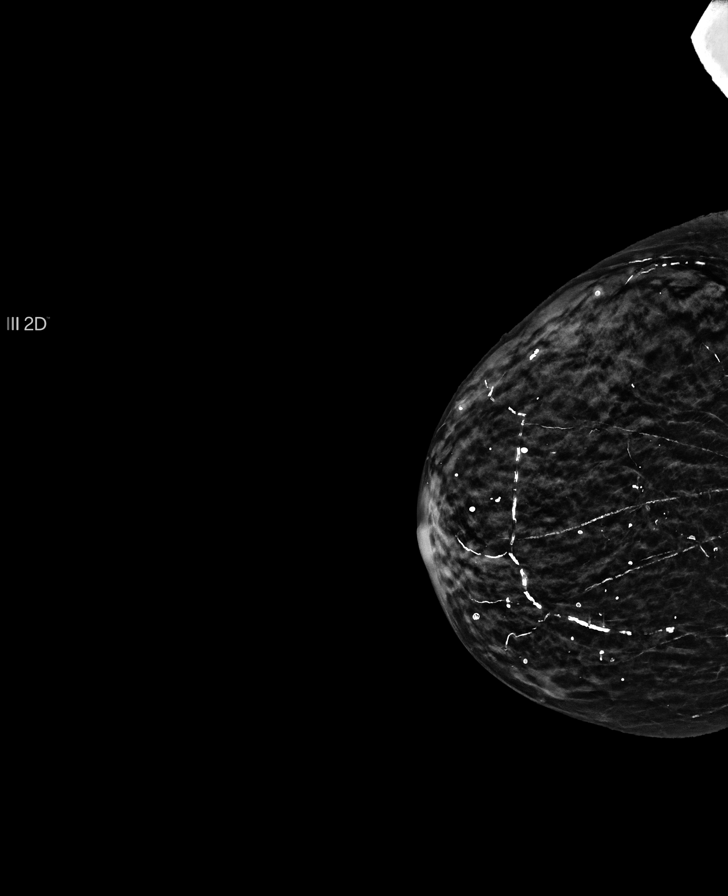

[L CC]
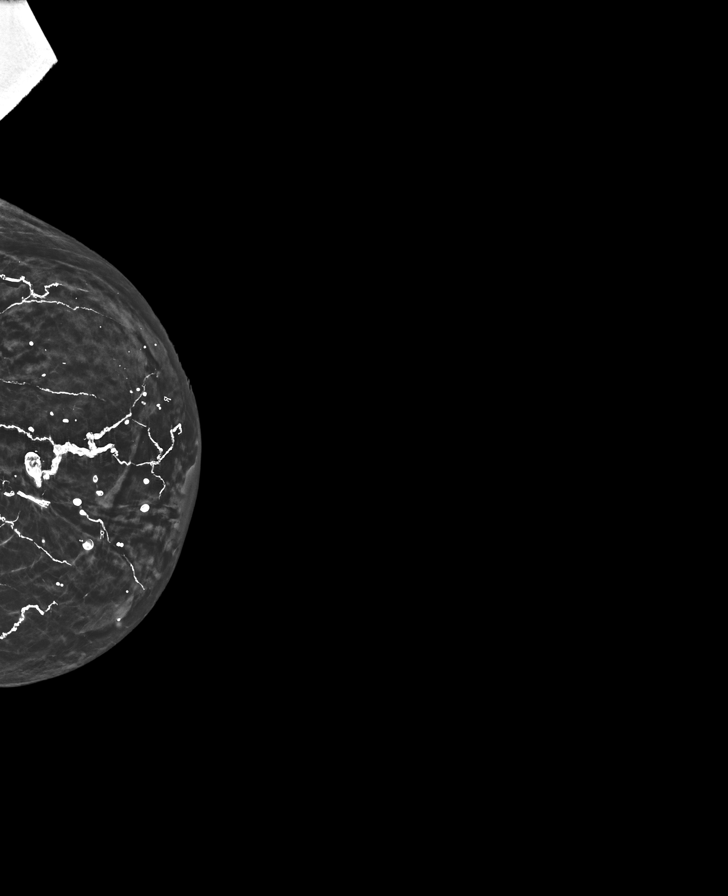

[L MLO tomo · tomo slice 15/28.0]
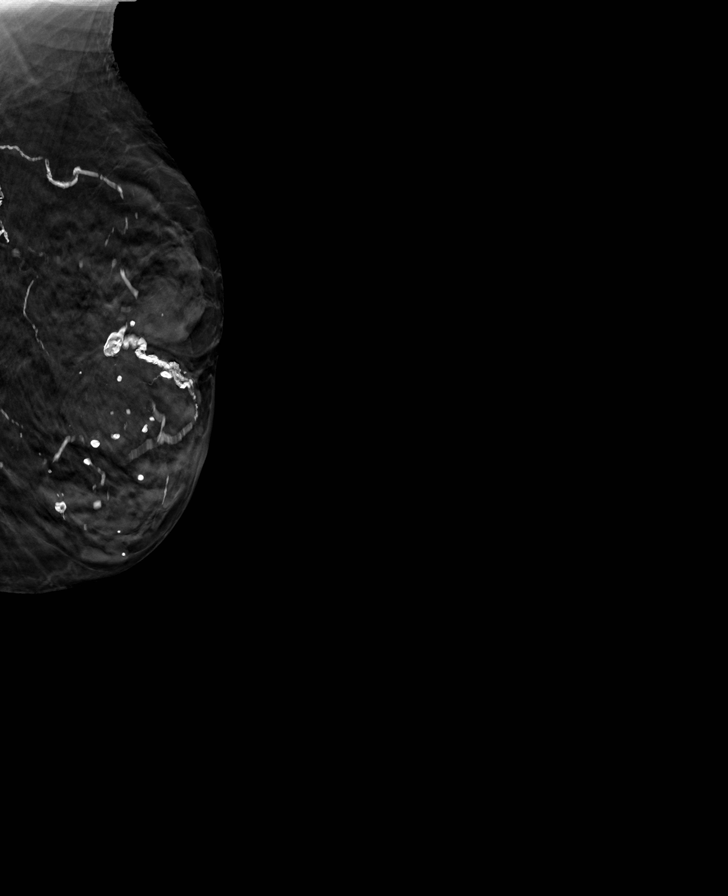

[L CC tomo · tomo slice 15/28.0]
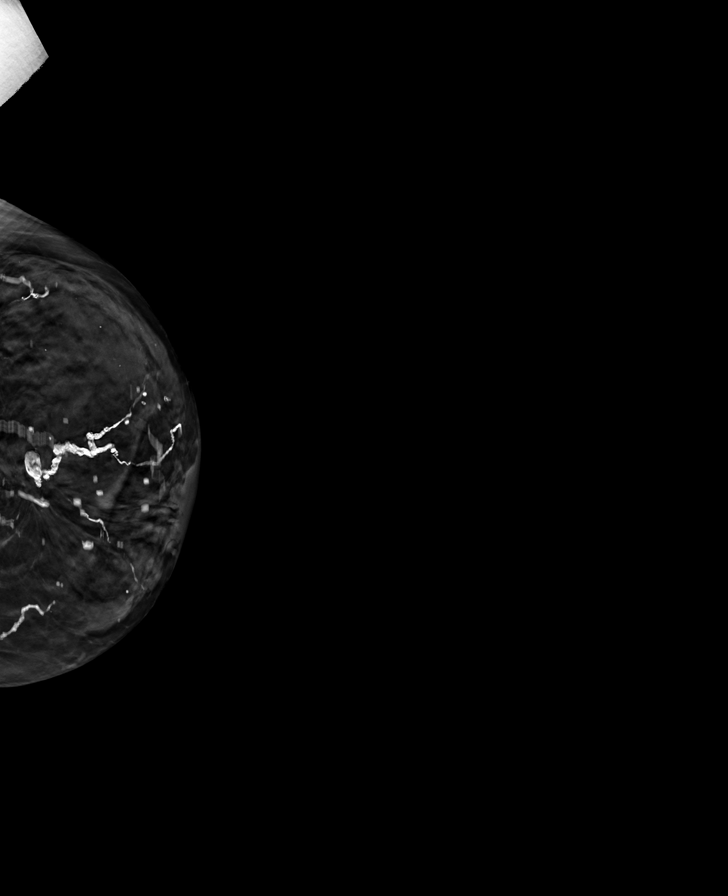

[R MLO tomo · tomo slice 21/42.0]
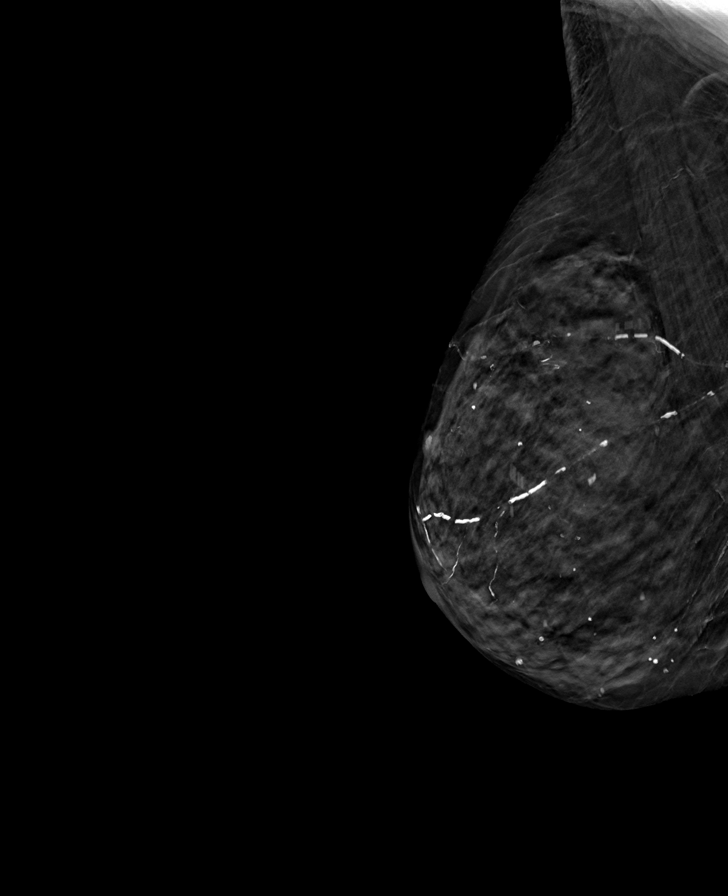

[R CC tomo · tomo slice 21/41.0]
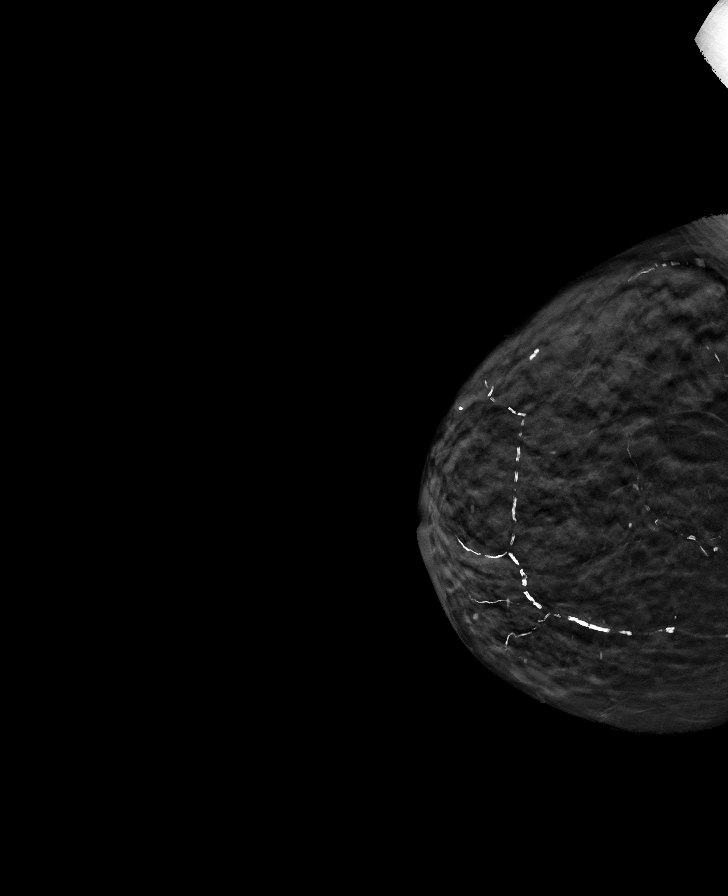

[8 of 24 positions shown; findings below may reference images not displayed]

FINDINGS: Benign-appearing vascular calcifications again seen in both breasts. 
No suspicious mass, calcifications, or area of architectural distortion in 
either breast.
IMPRESSION: (BI-RADS 2) Benign findings. Routine mammographic follow-up is recommended.

## 2022-07-13 IMAGING — MR MRI ABDOMEN WITHOUT CONTRAST
9 of 13 series · 33 of 48 positions shown · non-contrast
Comparison: None

________________________________________________________________________________________________ 
MRI ABDOMEN WITHOUT CONTRAST, 07/13/2022 [DATE]: 
CLINICAL INDICATION: History of abnormal imaging of the kidneys.
TECHNIQUE: Multiplanar, multiecho position MR images of the abdomen were 
performed without intravenous enhancement. Patient was scanned on a 3T magnet.

[Series 201: survey-head 1st · axial · 15.0mm · 1.76mm/px · z∈[-25,+224]mm · 2 of 11 slices shown]
[im 1/11]
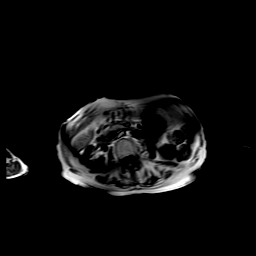
[im 11/11]
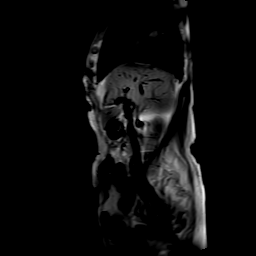

[Series 301: survey navi · axial · 15.0mm · 1.67mm/px · 1 of 11 slices shown]
[im 1/11]
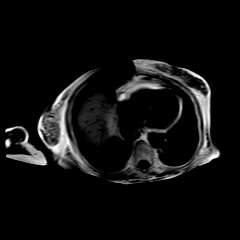

[Series 401: (id)* · coronal · 4.0mm · 0.78mm/px · 2 of 32 slices shown]
[im 1/32]
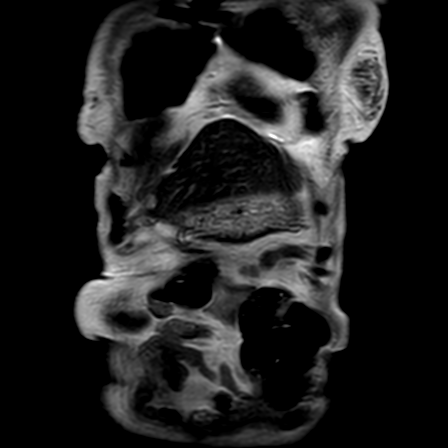
[im 32/32]
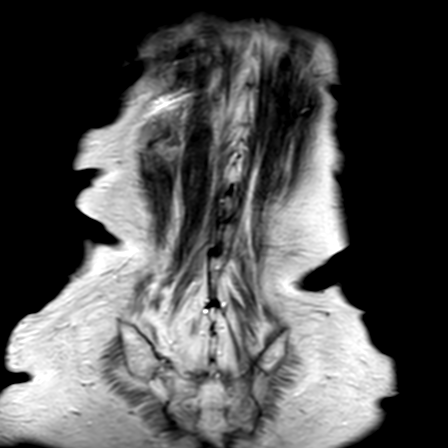

[Series 501: t2_ax_mvxd_hr_rt · axial · 5.0mm · 0.51mm/px · z∈[-98,+130]mm · 2 of 39 slices shown]
[im 1/39]
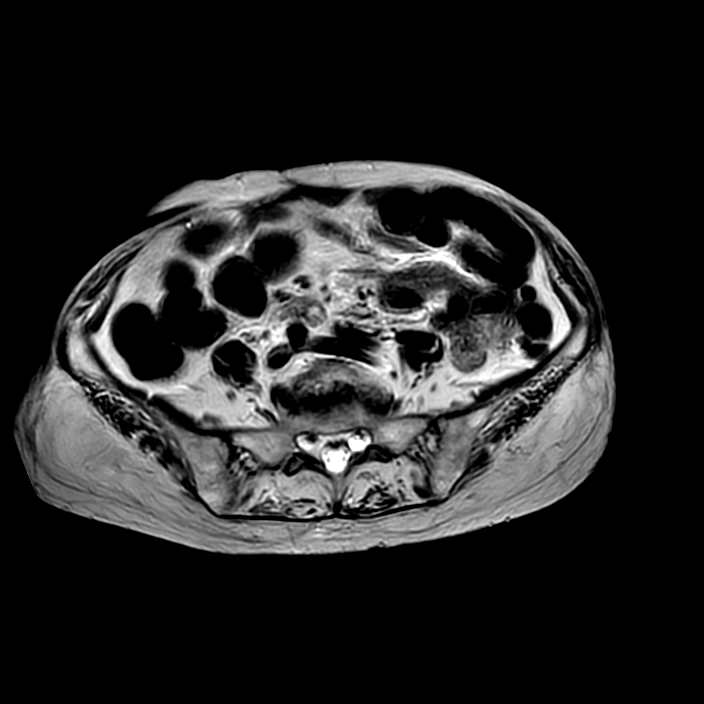
[im 39/39]
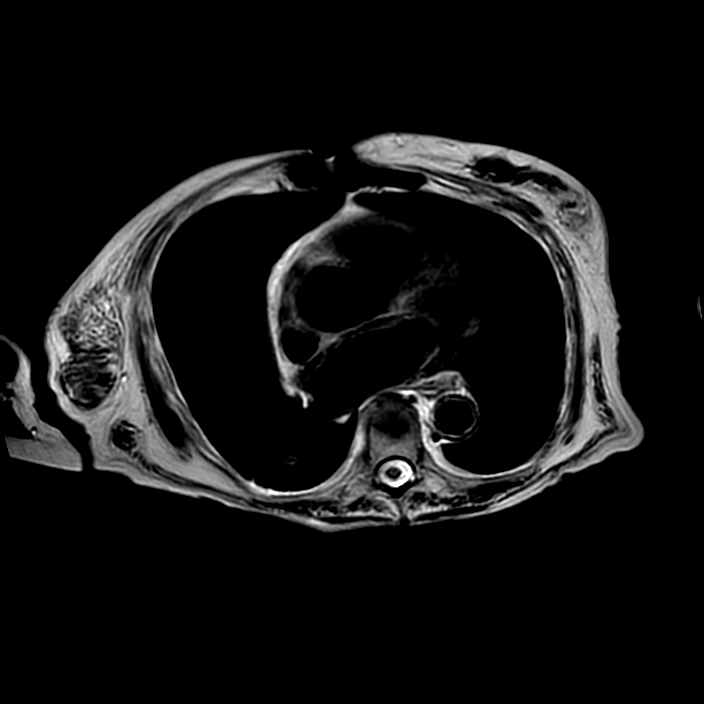

[Series 601: t2_spir_ax_mvxd_hr_rt · axial · 5.0mm · 0.93mm/px · z∈[-98,+130]mm · 2 of 39 slices shown]
[im 1/39]
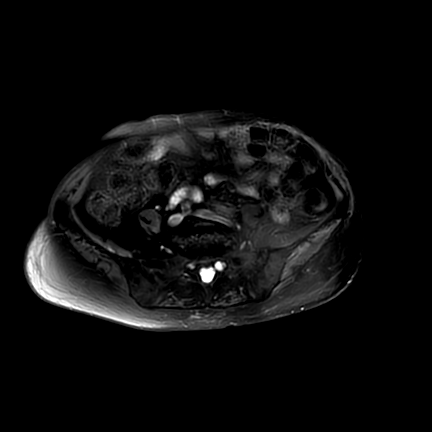
[im 39/39]
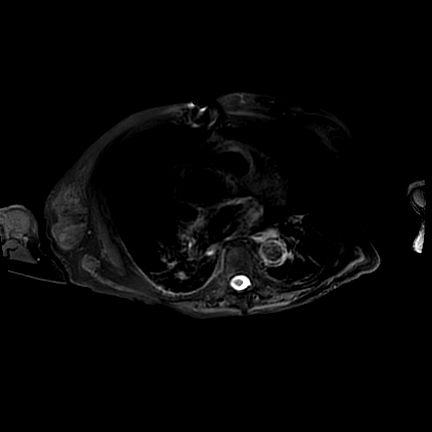

[Series 702: smdixon-f · axial · 3.5mm · 0.99mm/px · z∈[-87,+130]mm · 7 of 125 slices shown]
[im 1/125]
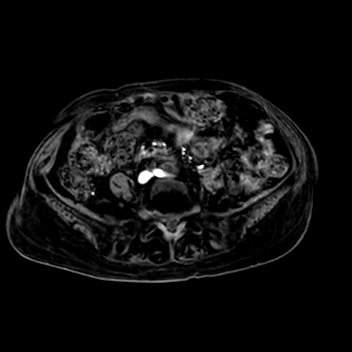
[im 21/125]
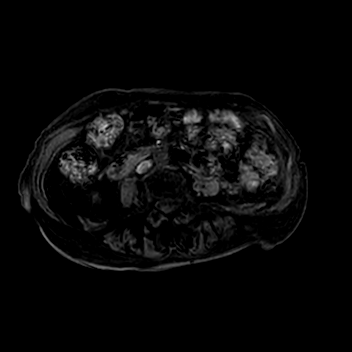
[im 42/125]
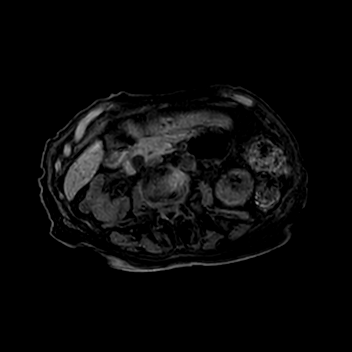
[im 63/125]
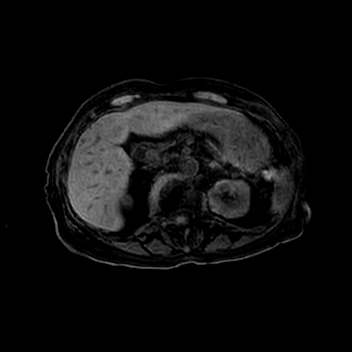
[im 83/125]
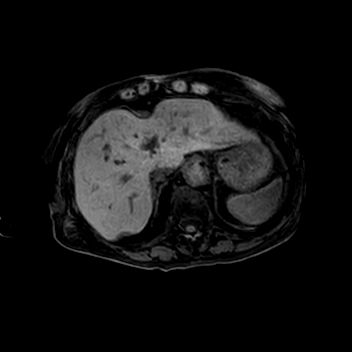
[im 104/125]
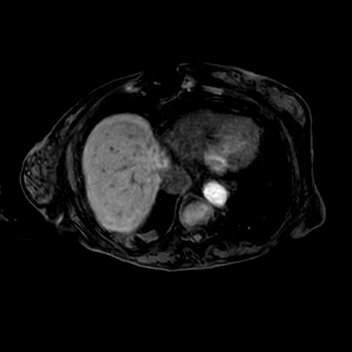
[im 125/125]
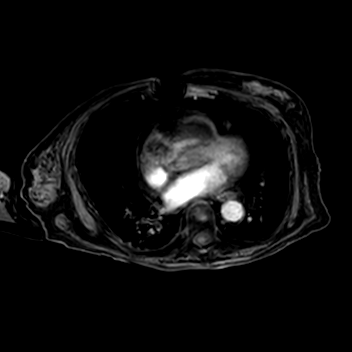

[Series 703: smdixon-in phase · axial · 3.5mm · 0.99mm/px · z∈[-87,+130]mm · 7 of 125 slices shown]
[im 1/125]
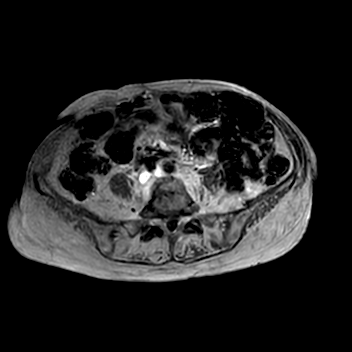
[im 21/125]
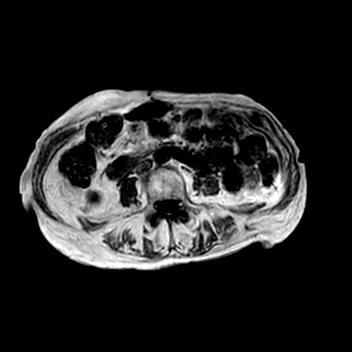
[im 42/125]
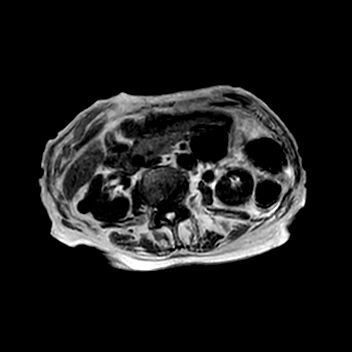
[im 63/125]
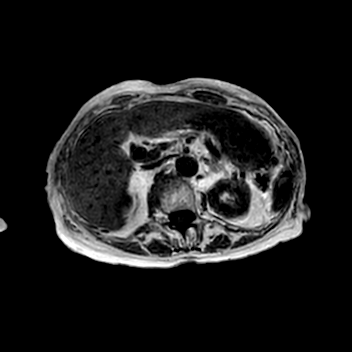
[im 83/125]
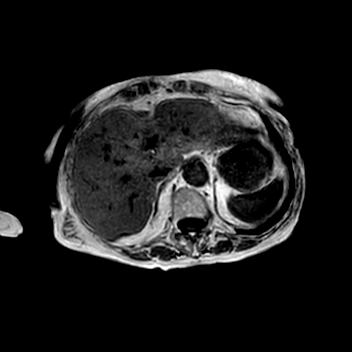
[im 104/125]
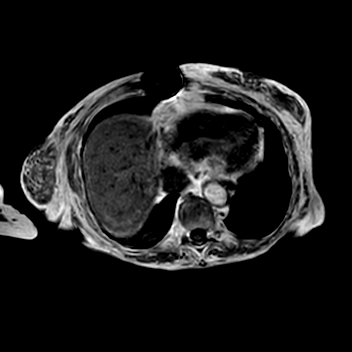
[im 125/125]
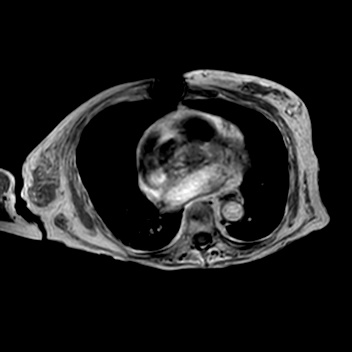

[Series 704: smdixon-out of phase · axial · 3.5mm · 0.99mm/px · z∈[-87,+130]mm · 7 of 125 slices shown]
[im 1/125]
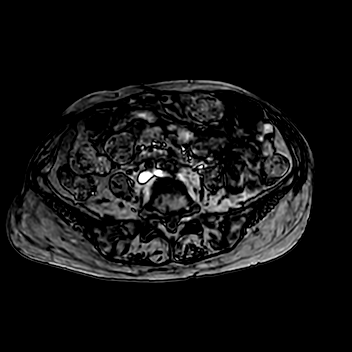
[im 21/125]
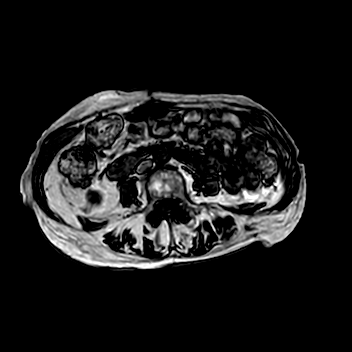
[im 42/125]
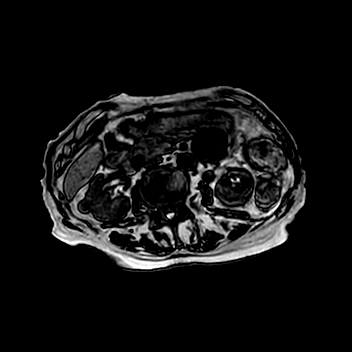
[im 63/125]
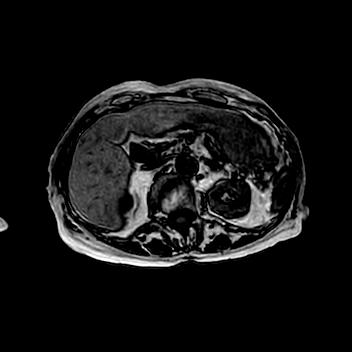
[im 83/125]
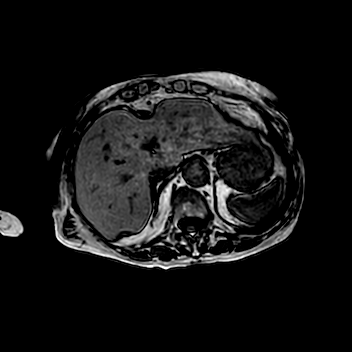
[im 104/125]
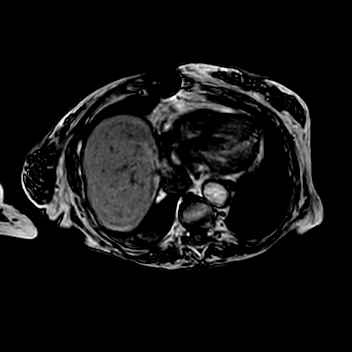
[im 125/125]
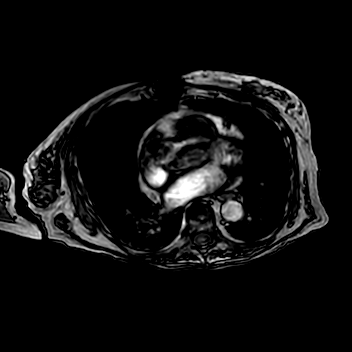

[Series 705: smdixon-water · axial · 3.5mm · 0.99mm/px · z∈[-87,-16]mm · 3 of 125 slices shown]
[im 1/125]
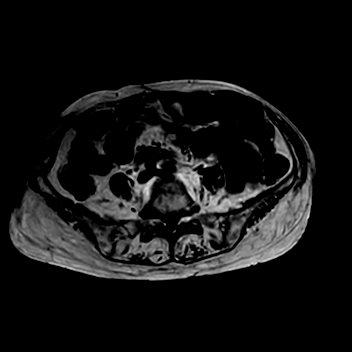
[im 21/125]
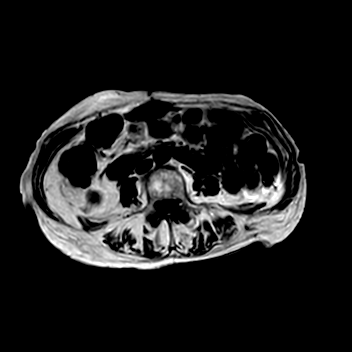
[im 42/125]
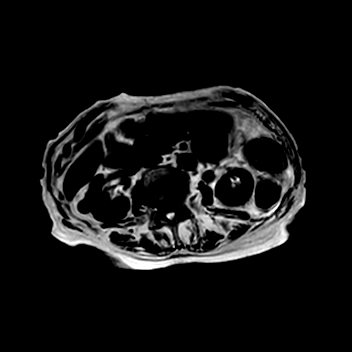

[33 of 48 positions shown; findings below may reference images not displayed]

FINDINGS: Bilateral renal cyst are identified. No suspicious mass or 
hydronephrosis seen on this noncontrasted MRI. 
The liver, spleen, pancreas and adrenal glands are unremarkable in appearance. 
There is scoliosis identified. 
Previous cholecystectomy. Prominent common bile duct measuring up to 1.4 cm 
above the pancreatic head on coronal images. I do not see a filling defect. No 
mass identified. No significant pancreatic ductal dilatation seen. 
Increased density seen in the dependent portion of both lung bases. Most likely 
atelectasis. Incompletely evaluated.
IMPRESSION: Simple cyst seen within the kidneys. 
Prominent common bile duct in this patient with previous cholecystectomy. 
Probable inflammatory changes lung bases right greater than the left 
incompletely evaluated. CT of the chest would better define.

## 2022-08-31 ENCOUNTER — Ambulatory Visit (INDEPENDENT_AMBULATORY_CARE_PROVIDER_SITE_OTHER): Payer: Medicare Other | Admitting: Physician Assistant

## 2022-08-31 ENCOUNTER — Other Ambulatory Visit: Payer: Self-pay

## 2022-08-31 ENCOUNTER — Encounter (INDEPENDENT_AMBULATORY_CARE_PROVIDER_SITE_OTHER): Payer: Self-pay

## 2022-08-31 VITALS — BP 108/64 | HR 62 | Temp 97.7°F | Resp 14 | Ht 60.0 in | Wt 103.0 lb

## 2022-08-31 DIAGNOSIS — G5702 Lesion of sciatic nerve, left lower limb: Secondary | ICD-10-CM

## 2022-08-31 NOTE — Progress Notes (Signed)
Patient: Alexandra Graves   Date: 08/31/2022   MRN: [id number]     Subjective        Chief Complaint   Patient presents with    Hip Pain     Pain in left hip that goes to back. It started a year ago.          Hip Pain       Alexandra Graves is a 89 y.o. female who presents today with complaints of left hip pain that began 1 year(s) ago. She reports a history of having that hip replaced. She denies falls (which was the precipitating event before her replacement, approximately 2017). It is a constant pain that improves a bit with rest. Worsens at times with a stabbing pain. She is seen by Dr. Ronnald Collum in [place]. Pain is localized to outer side of leg, low back, and buttocks  Mechanism of injury:  none  Exacerbating factors: walking, sitting, lifting, twisting, and bending   Alleviating factors: none, has tried mobic, and physical therapy  Denies: Associated symptoms: Numbness, Weakness  Radiation: denies  Prior injury to this area: Yes Hip fracture    History:  Pertinent Past Medical, Surgical, Family and Social History were reviewed.  Current Medications[1]  Allergies[2]  Medications and Allergies reviewed.         Objective   Vitals:    08/31/22 1112   BP: 108/64   Pulse: 62   Resp: 14   Temp: 97.7 F (36.5 C)   TempSrc: Tympanic   SpO2: 98%   Weight: 46.7 kg (103 lb)   Height: 1.524 m (5')     Body mass index is 20.12 kg/m.    Physical Exam  Vitals reviewed.   Constitutional:       General: She is not in acute distress.     Appearance: Normal appearance.   HENT:      Head: Normocephalic.      Nose: Nose normal.      Mouth/Throat:      Mouth: Mucous membranes are moist.   Cardiovascular:      Rate and Rhythm: Normal rate and regular rhythm.      Pulses: Normal pulses.   Pulmonary:      Effort: Pulmonary effort is normal.      Breath sounds: Normal breath sounds.   Abdominal:      General: Abdomen is flat.   Musculoskeletal:         General: Tenderness present. No swelling, deformity or signs of injury. Normal range of  motion.      Cervical back: Normal range of motion.      Right lower leg: No edema.      Left lower leg: No edema.   Skin:     General: Skin is warm and dry.      Capillary Refill: Capillary refill takes less than 2 seconds.      Findings: Bruising present.      Comments: Mild occasional ecchymosis   Neurological:      General: No focal deficit present.      Mental Status: She is alert.              UCC Course   There were no labs reviewed with this patient during the visit.  There were no x-rays reviewed with this patient during the visit.  Current Inpatient Medications with Last Dose Taken[3]       Procedures   Procedures      MDM/Assessment  Differential Diagnosis including but not limited to: sprain, strain, fracture, dislocation, contusion, hematoma  Encounter Diagnosis   Name Primary?    Piriformis syndrome of left side Yes                                                                                                                                                                       Plan  Referral to orthopedic spine for likely piriformis syndrome  - has failed conservative treatment including 4 months of PT and prescription NSAIDS (mobic)      VSS, well appearing, stable for home discharge, no red flags  Supportive measures, risks and benefits of therapy discussed,  Pt agrees and understands.  Follow up with primary care provider within 3 days, or follow up in clinic if symptoms worsening or not resolving as expected   If new, persistent or worsening symptoms, please  go to nearest Emergency Room  Discharge instructions discussed, all questions answered.    Patient verbalized understanding of plan of care and had no further questions/concerns            No orders of the defined types were placed in this encounter.    Requested Prescriptions      No prescriptions requested or ordered in this encounter       Discussed results and diagnosis with patient/family.  Reviewed warning signs for worsening  condition, as well as, indications for follow-up with primary care physician and return to urgent care clinic.   Patient/family expressed understanding of instructions.     An After Visit Summary with pertinent information was made available to patient/family via MyChart or in-print.       [1]   Current Outpatient Medications:     amLODIPine (NORVASC) 5 MG tablet, , Disp: , Rfl:     aspirin EC 81 MG EC tablet, Take 1 tablet (81 mg) by mouth daily, Disp: , Rfl:     Biotin 1000 MCG Chew Tab, Chew by mouth, Disp: , Rfl:     Bystolic 5 MG tablet, , Disp: , Rfl:     ciclopirox (PENLAC) 8 % solution, , Disp: , Rfl:     Denosumab (PROLIA SC), Inject into the skin, Disp: , Rfl:     losartan-hydrochlorothiazide (HYZAAR) 100-25 MG per tablet, , Disp: , Rfl:     losartan-hydrochlorothiazide (HYZAAR) 100-25 MG per tablet, , Disp: , Rfl:     meloxicam (MOBIC) 15 MG tablet, Take 1 tablet (15 mg) by mouth daily, Disp: , Rfl:     omeprazole (PriLOSEC) 40 MG capsule, , Disp: , Rfl:     propafenone (RYTHMOL) 150 MG tablet, , Disp: , Rfl:     spironolactone (ALDACTONE) 25 MG tablet, , Disp: , Rfl:   [  2] No Known Allergies  [3]   No current facility-administered medications for this visit.

## 2022-08-31 NOTE — Patient Instructions (Addendum)
Piriformis Syndrome    Tylenol 1000mg  - every 8 hours

## 2022-08-31 NOTE — Progress Notes (Signed)
Ambulatory Care Management Post-Discharge Support     Care Coordinator attempted to contact the patient at  [phone]  to assist in scheduling a follow up appointment. Patient did not answer and writer left a detailed message requesting a call back.      Earney Navy, LPN  Care Coordinator, Ambulatory Care Management   [facility]  [address] [place], [place] [address]

## 2022-09-05 ENCOUNTER — Emergency Department
Admission: EM | Admit: 2022-09-05 | Discharge: 2022-09-05 | Disposition: A | Discharge status: Home / Self Care | Payer: Medicare Other | Attending: Emergency Medicine | Admitting: Emergency Medicine

## 2022-09-05 ENCOUNTER — Emergency Department: Payer: Medicare Other

## 2022-09-05 DIAGNOSIS — M8008XA Age-related osteoporosis with current pathological fracture, vertebra(e), initial encounter for fracture: Secondary | ICD-10-CM | POA: Insufficient documentation

## 2022-09-05 DIAGNOSIS — M5136 Other intervertebral disc degeneration, lumbar region: Secondary | ICD-10-CM | POA: Insufficient documentation

## 2022-09-05 DIAGNOSIS — M25552 Pain in left hip: Secondary | ICD-10-CM | POA: Insufficient documentation

## 2022-09-05 DIAGNOSIS — S32030A Wedge compression fracture of third lumbar vertebra, initial encounter for closed fracture: Secondary | ICD-10-CM

## 2022-09-05 DIAGNOSIS — M5137 Other intervertebral disc degeneration, lumbosacral region: Secondary | ICD-10-CM | POA: Insufficient documentation

## 2022-09-05 DIAGNOSIS — S32030G Wedge compression fracture of third lumbar vertebra, subsequent encounter for fracture with delayed healing: Secondary | ICD-10-CM

## 2022-09-05 DIAGNOSIS — M545 Low back pain, unspecified: Secondary | ICD-10-CM

## 2022-09-05 LAB — LAB USE ONLY - URINALYSIS WITH REFLEX TO MICROSCOPIC EXAM AND CULTURE
Urine Bilirubin: NEGATIVE
Urine Blood: NEGATIVE
Urine Glucose: NEGATIVE
Urine Ketones: NEGATIVE mg/dL
Urine Leukocyte Esterase: NEGATIVE
Urine Nitrite: NEGATIVE
Urine Protein: NEGATIVE
Urine Specific Gravity: 1.019 (ref 1.001–1.035)
Urine Urobilinogen: NORMAL mg/dL (ref 0.2–2.0)
Urine pH: 6 (ref 5.0–8.0)

## 2022-09-05 LAB — LAB USE ONLY - URINE GRAY CULTURE HOLD TUBE

## 2022-09-05 MED ORDER — NAPROXEN 250 MG PO TABS
500.0000 mg | ORAL_TABLET | Freq: Two times a day (BID) | ORAL | 0 refills | Status: AC
Start: 2022-09-05 — End: 2022-09-12

## 2022-09-05 MED ORDER — OXYCODONE HCL 5 MG PO TABS
2.5000 mg | ORAL_TABLET | Freq: Once | ORAL | Status: AC
Start: 2022-09-05 — End: 2022-09-05
  Administered 2022-09-05: 2.5 mg via ORAL
  Filled 2022-09-05: qty 1

## 2022-09-05 MED ORDER — LIDOCAINE 5 % EX PTCH
1.0000 | MEDICATED_PATCH | Freq: Once | CUTANEOUS | Status: DC
Start: 2022-09-05 — End: 2022-09-05
  Administered 2022-09-05: 1 via TRANSDERMAL
  Filled 2022-09-05: qty 1

## 2022-09-05 MED ORDER — OXYCODONE HCL 5 MG PO TABS
2.5000 mg | ORAL_TABLET | Freq: Four times a day (QID) | ORAL | 0 refills | Status: DC | PRN
Start: 2022-09-05 — End: 2022-09-22

## 2022-09-05 MED ORDER — NAPROXEN 250 MG PO TABS
250.0000 mg | ORAL_TABLET | Freq: Two times a day (BID) | ORAL | Status: DC
Start: 2022-09-05 — End: 2022-09-05
  Administered 2022-09-05: 250 mg via ORAL
  Filled 2022-09-05: qty 1

## 2022-09-05 MED ORDER — LIDOCAINE 4 % EX PTCH
1.0000 | MEDICATED_PATCH | Freq: Two times a day (BID) | CUTANEOUS | 0 refills | Status: DC | PRN
Start: 2022-09-05 — End: 2022-10-30

## 2022-09-05 MED ORDER — DOCUSATE SODIUM 100 MG PO CAPS
100.0000 mg | ORAL_CAPSULE | Freq: Two times a day (BID) | ORAL | 0 refills | Status: AC
Start: 2022-09-05 — End: 2022-09-19

## 2022-09-05 MED ORDER — POLYETHYLENE GLYCOL 3350 17 G PO PACK
17.0000 g | PACK | Freq: Every day | ORAL | 0 refills | Status: DC | PRN
Start: 2022-09-05 — End: 2022-10-30

## 2022-09-05 NOTE — ED Provider Notes (Signed)
History     Chief Complaint   Patient presents with    Back Pain    Hip Pain     HPI     Clinical Information & ED Course    HPI:    Alexandra Graves is a 89 y.o. female {CRMArrival:65981} {Desc; upper/lower:13779} {LEFT/RIGHT:42183} back pain x ***. Pt states the pain has been {Desc; intermittent/persistent/constant:12478} and {DOES/DOES NOT:21158} radiate to ***. Pt also c/o {crmback:64375}. Pt denies {crmback:64375}. Pt has taken *** at home for pain. ***    PMHx: ***  Allergies: ***  Surg: ***  FHx: ***  PCP: ***  LMP: ***  (***) smoker, (***) EtOH, (***) drugs    Exam: ***    Plan: ***    Results: ***    CT Lumbar Spine without Contrast   Final Result       Acute osteoporotic compression fracture of L3 left vertebral body with 40%   height loss. No retropulsion/spinal canal compromise.      Leota Jacobsen, MD   09/05/2022 12:35 PM      CT Hip Left WO Contrast   Final Result      1.No CT evidence for periprosthetic fracture or osteolysis of the left hip   arthroplasty. However, osteopenia limits evaluation. If there is persistent   clinical concern for occult fracture or osteolysis, further evaluation with   prosthetic protocol MRI is recommended.      Sandie Ano, MD   09/05/2022 12:40 PM            ED Course: ***    12:57 PM- Paged NS           [place] [facility]:   - No significant concern on exam to warrant further testing. Agrees with plan of care for home. Agrees to follow up with provider as directed. Will return to the ED if new or concern symptoms develop.   - At discharge, pt looked well, non-toxic, no distress, and is a good candidate for outpatient follow up. No signs of toxicity to suggest need for further labs, imaging, or for admission  - All questions were answered.     Reviewed case with attending, ***, who agrees with treatment plan.     Medical Decision Making    Differential diagnosis includes but is not limited to: MSK Back Pain, Radiculopathy, DDD, Herniated Disc, Sciatica, Kidney Stone,  ***    MDM: ***    No red flags (fever, saddle anesthesia, loss of control of bowel or bladder, decrease strength on exam, urinary retention) concerning for epidural abscess or cauda equina syndrome, no indication for emergent MRI at this time.    {ZOXWRU:04540}     Attestations    Documentation Notes:   *Parts of this note were generated by the Epic EMR system/ Dragon speech recognition and may contain inherent errors or omissions not intended by the user. Grammatical errors, random word insertions, deletions, pronoun errors and incomplete sentences are occasional consequences of this technology due to software limitations. Not all errors are caught or corrected.     My documentation is often completed after the patient is no longer under my clinical care. In some cases, the Epic EMR may pull updated results into the above documentation which may not reflect all results or information that was available to me at the time of my medical decision making.      If there are questions or concerns about the content of this note or information contained within the  body of this dictation they should be addressed directly with the author for clarification.*      Medical History[1]    Past Surgical History:   Procedure Laterality Date    open heart surgery      Patient says about 6-7 years ago     TOTAL HIP ARTHROPLASTY  2018       Family History   Family history unknown: Yes       Social  Social History[2]    .     Allergies[3]    Home Medications               amLODIPine (NORVASC) 5 MG tablet          aspirin EC 81 MG EC tablet     Take 1 tablet (81 mg) by mouth daily     Biotin 1000 MCG Chew Tab     Chew by mouth     Bystolic 5 MG tablet          ciclopirox (PENLAC) 8 % solution          Denosumab (PROLIA SC)     Inject into the skin     losartan-hydrochlorothiazide (HYZAAR) 100-25 MG per tablet          losartan-hydrochlorothiazide (HYZAAR) 100-25 MG per tablet          meloxicam (MOBIC) 15 MG tablet     Take 1 tablet (15  mg) by mouth daily     omeprazole (PriLOSEC) 40 MG capsule          propafenone (RYTHMOL) 150 MG tablet          spironolactone (ALDACTONE) 25 MG tablet                  Review of Systems    Physical Exam    BP: 127/75, Heart Rate: 62, Temp: 97.2 F (36.2 C), Resp Rate: 16, SpO2: 99 %    Physical Exam      MDM and ED Course     ED Medication Orders (From admission, onward)      None               MDM                 Procedures    Clinical Impression & Disposition     Clinical Impression  Final diagnoses:   None        ED Disposition       None             New Prescriptions    No medications on file                     [1]   Past Medical History:  Diagnosis Date    Hypertension     Osteoporosis    [2]   Social History  Tobacco Use    Smoking status: Never    Smokeless tobacco: Never   Vaping Use    Vaping status: Never Used   Substance Use Topics    Alcohol use: Yes     Comment: wine    Drug use: Never   [3] No Known Allergies

## 2022-09-05 NOTE — Discharge Instr - AVS First Page (Signed)
NEUROSURGERY DISCHARGE INSTRUCTIONS:    [address]  [place], [place] [address]  Phone: [phone] and Fax: [phone]    FOLLOW-UP:  You will need a follow-up in 6 week with Neurosurgeon in [place]. [place] clinic information listed above if pain worsens and would like to see provider at a sooner time.   IMAGING:  Call the neurosurgery office to request for the xray order.    You will need a repeat xray of the spine, to be completed 1-2 days prior to your appointment with the neurosurgery team.  BRACE:  Wear LSO brace when out of bed, when head of bed greater than 30 degrees, sitting upright or when out of bed.    You may remove LSO for brief period of time for skin care or shower only, however, avoid bending, lifting or twisting.    Instructions for Back/Neck fracture without surgery  EXPECTATIONS:  Call us if the symptoms suddenly worsen.   MEDICATIONS:  Avoid taking nicotine, steroids, NSAIDs / Aleve / Ibuprofen / Toradol /celebrex in the first 12 weeks after your spine fracture.  Take Tylenol for pain.  SMOKING:  Smoking delays the healing process; we request that you avoid smoking if possible  ACTIVITY:  No bending or lifting anything greater than about 5 lbs. It is very important to get up and moving at home, but it is equally important to not overdo activities. Plan to have some assistance at home for the first few days.   You will be able to walk, take stairs (carefully and slowly), use the bathroom, and get up from sitting to standing using the arms of the chair as support.   Slowly increase your activity back to baseline. For example, increase from a short, less than 5 minute walk the first few days, to around the block and up to 15-20 minutes after a week.   Do not bend, twist or lean to pick things up or change positions. Remember good back mechanics and use your legs to sit down and stand up.  Less movement of the fractured bone allows for  greater chance of healing.  Activities that  keep spine in good alignment and minimize movement of the fractured bone is the recommendation at this time.  PHYSICAL THERAPY:  We will discuss PT at your first follow up visit. You will not begin therapy until after your first follow up appointment.  SEXUAL ACTIVITY:  Sexual activity is not advised until you have followed up with [place] in the clinic.   DRIVING:  If you had a fracture in your back, you may resume driving after 1 week.   For neck fracture you need to wait until you have followed up with the clinic and are out of the cervical collar.   WORK:  Do not return to work until you have been advised to do so by staff. Generally we recommend taking 4 weeks off of work for recovery, but each individual situation will be reviewed independently.  HOUSEWORK:  Avoid vacuuming or laundry until you have followed up in the clinic. The bending and lifting motion can place pressure on your back and neck  PROBLEMS or CONCERNS:  Please feel free to call the office at any time for problems, concerns, or questions you may have.   We are always available to help you or answer a question. In an emergency, please call 911 or go to the nearest Emergency Department.

## 2022-09-05 NOTE — Progress Notes (Signed)
Involvement - Spine     Reason for visit - Delivery      Ordered by Ninfa Meeker, NP                                                                         Rx - LSO     Clinical plan - DAW     Justification if custom - TREND LSO    Clinical outcome - Brace meets specifications of order and is free from defects.     Procedure - Delivered     Goal - Provide support and limit range of motion     Patient caretaker is able to properly don and doff device - YES

## 2022-09-05 NOTE — ED Provider Notes (Signed)
[facility] EMERGENCY DEPARTMENT  ATTENDING PHYSICIAN ATTESTATION NOTE     Patient Name: Alexandra Graves, Alexandra Graves Date:  09/05/2022  Attending Physician: Jodene Nam, MD MPH  Room:  N L/N L  Patient DOB:  06-Jul-1931  Age: 89 y.o. female  MRN:  [id number]  PCP: Pcp, None, MD               Nursing Triage note: Pt arrives with WC, c/o pain across entire back and Left sided hip for "months" however gotten worse in the past few days. Patient reports taking tylenol today with mild relief.    Pulse 62  BP 127/75  Resp 16  SpO2 99 %  Temp 97.2 F (36.2 C)    THIS IS MY APP SUPERVISORY AND SHARED VISIT NOTE:    I personally saw the patient and made/approved the management plan and take responsibility for the patient management.      HPI/PE/MDM:     89 y/o F with hx of breast ca and HTN. 1 year of left hip pain with radiation to the back 8 months ago. Has not had imaging outside of hip XR. Pain worsened over the last week with pain worse with prolonged standing. No fever.     On exam, appears comfortable at rest, sitting up without significant discomfort.    Patient presents in the setting of low back pain with some radiation to left hip.  CT was done here showing likely osteoporotic L3 vertebral compression fracture.  Neurosurgery was consulted, arrange for bracing which was placed, post brace x-ray was done.  Will advise outpatient follow-up with spine specialist.  Patient comfortable at time of discharge, signs, symptoms and indications provided to return, follow up instructions provided.           Results       Procedure Component Value Units Date/Time    Urinalysis with Reflex to Microscopic Exam and Culture (Order) [[id number]] Collected: 09/05/22 1117    Specimen: Urine, Clean Catch Updated: 09/05/22 1300    Narrative:      The following orders were created for panel order Urinalysis with Reflex to Microscopic Exam and Culture (Order).  Procedure                               Abnormality         Status                      ---------                               -----------         ------                     Urinalysis with Reflex t.Marland KitchenMarland Kitchen[[id number]]  Normal              Final result               Urine [employer] .Marland KitchenMarland Kitchen[[id number]]                      Final result                 Please view results for these tests on the individual orders.    Urine [employer] Tube [[id number]] Collected: 09/05/22 1117    Specimen:  Urine, Clean Catch Updated: 09/05/22 1300     Extra Tube Hold for add-ons.    Urinalysis with Reflex to Microscopic Exam and Culture (Component) [[id number]]  (Normal) Collected: 09/05/22 1117    Specimen: Urine, Clean Catch Updated: 09/05/22 1151     Urine Color Colorless     Urine Clarity Clear     Urine Specific Gravity 1.019     Urine pH 6.0     Urine Leukocyte Esterase Negative     Urine Nitrite Negative     Urine Protein Negative     Urine Glucose Negative     Urine Ketones Negative mg/dL      Urine Urobilinogen Normal mg/dL      Urine Bilirubin Negative     Urine Blood Negative    Narrative:      Urine Microscopic not indicated            XR Lumbar Spine AP And Lateral    Result Date: 09/05/2022   Stable L3 fracture Trilby Drummer, MD 09/05/2022 4:34 PM    CT Hip Left WO Contrast    Result Date: 09/05/2022  1.No CT evidence for periprosthetic fracture or osteolysis of the left hip arthroplasty. However, osteopenia limits evaluation. If there is persistent clinical concern for occult fracture or osteolysis, further evaluation with prosthetic protocol MRI is recommended. Sandie Ano, MD 09/05/2022 12:40 PM    CT Lumbar Spine without Contrast    Result Date: 09/05/2022   Acute osteoporotic compression fracture of L3 left vertebral body with 40% height loss. No retropulsion/spinal canal compromise. Leota Jacobsen, MD 09/05/2022 12:35 PM      Attending Attestation:  The patient was seen and examined by the mid-level/resident and the plan of care was discussed with me. I agree with the plan as it was presented to  me and was present during the key portions of any procedures performed.  I have reviewed and agree with the final ED diagnosis. Please see the separately documented midlevel/resident note for additional information including but not limited to full history of present illness, review of systems and comprehensive physical exam.    The patient's past medical records, including those in Care Everywhere when necessary, were reviewed by me.     Diagnosis/Disposition:     Final Impression  Final diagnoses:   Lumbar back pain   Closed compression fracture of L3 lumbar vertebra, initial encounter   Left hip pain     Disposition  ED Disposition       ED Disposition   Discharge    Condition   --    Date/Time   Tue Sep 05, 2022  4:40 PM    Comment   Alexandra Graves discharge to home/self care.    Condition at disposition: Stable               Follow up  Sherre Lain, MD  [address]  [place] [address]  [phone]    Follow up in 6 week(s)  with repeat flexion and extention xr of lumbar spine    [facility] Emergency Dept  [address]  [place] [address]  [phone]    If symptoms worsen    Prescriptions  Discharge Medication List as of 09/05/2022  4:41 PM        START taking these medications    Details   docusate sodium (COLACE) 100 MG capsule Take 1 capsule (100 mg) by mouth 2 (two) times  daily for 14 days, Starting Tue 09/05/2022, Until Tue 09/19/2022, E-Rx      Lidocaine (Salonpas Pain Relieving) 4 % Patch Apply 1 patch topically every 12 (twelve) hours as needed (Back Pain) 1 patch every 24 hours., Starting Tue 09/05/2022, E-Rx      naproxen (NAPROSYN) 250 MG tablet Take 2 tablets (500 mg) by mouth 2 (two) times daily with meals for 7 days, Starting Tue 09/05/2022, Until Tue 09/12/2022, E-Rx      oxyCODONE (ROXICODONE) 5 MG immediate release tablet Take 0.5-1 tablets (2.5-5 mg) by mouth every 6 (six) hours as needed for Pain Attending is Dr. Jodene Nam, Starting Tue 09/05/2022, Until  Tue 09/12/2022 at 2359, E-Rx      polyethylene glycol (MIRALAX) 17 g packet Take 17 g by mouth daily as needed (Constipation), Starting Tue 09/05/2022, E-Rx                ATTESTATIONS     Jodene Nam, MD MPH                     Eldridge Dace, MD  09/05/22 2032

## 2022-09-05 NOTE — Discharge Instructions (Signed)
Dear [name]:    Thank you for choosing the [facility] Emergency Department, the premier emergency department in the [place] area.  I hope your visit today was EXCELLENT. You will receive a survey via text message that will give you the opportunity to provide feedback to your team about your visit. Please do not hesitate to reach out with any questions!    Specific instructions for your visit today:    Lumbar Compression Fracture     You have a compression fracture of a vertebra (bone) in your lumbar spine (lower back).     A compression fracture results when a fall or injury compresses the vertebrae. You can think of this as the cube-shaped vertebrae being partly "squashed" flat.     A fracture is a break in a bone. It means the same thing as saying a "broken bone."     Treatment of your fracture may include the following:  The use of a custom-made back brace.  Pain medication.  Rest and limited activity.     YOU SHOULD SEEK MEDICAL ATTENTION IMMEDIATELY, EITHER HERE OR AT THE NEAREST EMERGENCY DEPARTMENT, IF ANY OF THE FOLLOWING OCCURS:  Your pain gets much worse.  One or both of your legs begins to tingle or feel numb.  You cannot control your urine or bowel movements (you soil or wet yourself).  You vomit or have severe constipation or abdominal pain.                 IF YOU DO NOT CONTINUE TO IMPROVE OR YOUR CONDITION WORSENS, PLEASE CONTACT YOUR DOCTOR OR RETURN IMMEDIATELY TO THE EMERGENCY DEPARTMENT.    Sincerely,  Alexandra Sport, PA-C &  Alexandra Dace, MD  Attending Emergency Physician  [facility] Emergency Department    ONSITE PHARMACY  Our full service onsite pharmacy is located in the ER waiting room.  Open 7 days a week from 9 am to 9 pm.  We accept all major insurances and prices are competitive with major retailers.  Ask your provider to print your prescriptions down to the pharmacy to speed you on your way home.    OBTAINING A PRIMARY CARE APPOINTMENT    Primary care physicians  (PCPs, also known as primary care doctors) are either internists or family medicine doctors. Both types of PCPs focus on health promotion, disease prevention, patient education and counseling, and treatment of acute and chronic medical conditions.    If you need a primary care doctor, please call the below number and ask who is receiving new patients.     [facility]  Telephone:  [phone]  [email or web address]    DOCTOR REFERRALS  Call [phone] (available 24 hours a day, 7 days a week) if you need any further referrals and we can help you find a primary care doctor or specialist.  Also, available online at:  [email or web address]    YOUR CONTACT INFORMATION  Before leaving please check with registration to make sure we have an up-to-date contact number.  You can call registration at [phone] to update your information.  For questions about your hospital bill, please call [phone].  For questions about your Emergency Dept Physician bill please call [phone].      FREE HEALTH SERVICES  If you need help with health or social services, please call 2-1-1 for a free referral to resources in your area.  2-1-1 is a free service connecting people with information on health insurance, free clinics, pregnancy, mental  health, dental care, food assistance, housing, and substance abuse counseling.  Also, available online at:  http://www.[facility].org    MEDICAL RECORDS AND TESTS  Certain laboratory test results do not come back the same day, for example urine cultures.   We will contact you if other important findings are noted.  Radiology films are often reviewed again to ensure accuracy.  If there is any discrepancy, we will notify you.      Please call [phone] to pick up a complimentary CD of any radiology studies performed.  If you or your doctor would like to request a copy of your medical records, please call [phone].      ORTHOPEDIC INJURY   Please know that  significant injuries can exist even when an initial x-ray is read as normal or negative.  This can occur because some fractures (broken Graves) are not initially visible on x-rays.  For this reason, close outpatient follow-up with your primary care doctor or bone specialist (orthopedist) is required.    MEDICATIONS AND FOLLOWUP  Please be aware that some prescription medications can cause drowsiness.  Use caution when driving or operating machinery.    The examination and treatment you have received in our Emergency Department is provided on an emergency basis, and is not intended to be a substitute for your primary care physician.  It is important that your doctor checks you again and that you report any new or remaining problems at that time.      24 HOUR PHARMACIES  The nearest 24 hour pharmacy is:    [facility]  [address]  [place], [place] [address]  [phone]      ASSISTANCE WITH INSURANCE    Affordable Care Act  [facility])  Call to start or finish an application, compare plans, enroll or ask a question.  [phone]  TTY: [phone]  Web:  Healthcare.gov    Help Enrolling in [facility]  [employer] [place]  831 047 1928 (TOLL-FREE)  346-316-9077 (TTY)  Web:  Http://www.coverva.org    Local Help Enrolling in the [facility]  [place] [facility]  [phone] (MAIN)  Email:  health-help@nvfs .org  Web:  BlackjackMyths.is  Address:  [address], [address] [place], [place] [address]    SEDATING MEDICATIONS  Sedating medications include strong pain medications (e.g. narcotics), muscle relaxers, benzodiazepines (used for anxiety and as muscle relaxers), Benadryl/diphenhydramine and other antihistamines for allergic reactions/itching, and other medications.  If you are unsure if you have received a sedating medication, please ask your physician or nurse.  If you received a sedating medication: DO NOT drive a car. DO NOT operate machinery. DO NOT perform jobs where you need to be  alert.  DO NOT drink alcoholic beverages while taking this medicine.     If you get dizzy, sit or lie down at the first signs. Be careful going up and down stairs.  Be extra careful to prevent falls.     Never give this medicine to others.     Keep this medicine out of reach of children.     Do not take or save old medicines. Throw them away when outdated.     Keep all medicines in a cool, dry place. DO NOT keep them in your bathroom medicine cabinet or in a cabinet above the stove.    MEDICATION REFILLS  Please be aware that we cannot refill any prescriptions through the ER. If you need further treatment from what is provided at your ER visit,  please follow up with your primary care doctor or your pain management specialist.    FREESTANDING EMERGENCY DEPARTMENTS OF [facility]  Did you know [facility] has two freestanding ERs located just a few miles away?  [facility] and [facility] [place] have short wait times, easy free parking directly in front of the building and top patient satisfaction scores - and the same Board Certified Emergency Medicine doctors as [facility].

## 2022-09-05 NOTE — Consults (Signed)
NEUROSURGERY CONSULTATION    Date/Time: 09/05/22 1:03 PM  Consulting Physician: Dr. [name]   Covered By:  Neurosurgery Team D, [facility] [phone]), 6PM-6AM: Night Coverage [phone]). 24/7 pager [phone]    Assessment:     89 y.o. female with a PMH of HTN, left hip replacement, and breast CA p/w with worsening left hip pain that radiates to the low back. CT LS demonstrating acute L3 compression fx with 40% LOH without RP. On exam, pt tender to palpation on midline L spine, otherwise non-focal.     Initial Recommendation:     - ok to discharge from NSGY standpoint after brace delivery.  - Neurosurgical plan:             - No acute intervention at this time; medically manage. Pt to wear LSO brace when OOB and use of multimodal pain medication.  Pt lives in [place] and wanted to continue care in [place]. Pt reports if pain is not resolved she would like to have [place] clinic information (provided in AVS). Pt to follow up with neurosurgeon in 6 weeks, with flex-ex of LS.   - Imaging:  - No further imaging at this time  - Activity/Positioning:  - LSO brace when OOB (ok to don and doff at edge of bed)   - ad lib  - Medications:  - Multimodal pain control with QD bowel regimen.  - No therapeutic anticoagulants/antiplatelets/NSAIDs  - Disposition:   - per ED    History of Present Illness:     Chief Complaint   Patient presents with    Back Pain    Hip Pain       Alexandra Graves is a 89 y.o. female with a PMH of HTN, left hip replacement (6 years ago), and breast CA p/w with worsening left hip pain that radiates to the low back.Today, she reports 10/10 pain when ambulating, starts at left hip and radiates to low back. Pt reports pain began months ago at left hip, but recently hasn't been able to ambulate for longer than few minutes; subsequently coming to ED. She denies new weakness, shooting pain, numbness and tingling, bowel or bladder dysfunction, or saddle anesthesia. She denies new trauma or any recent  falls.      Past Medical History:     Medical History[1]      Past Surgical History:     Past Surgical History:   Procedure Laterality Date    open heart surgery      Patient says about 6-7 years ago     TOTAL HIP ARTHROPLASTY  2018           Social History:     Tobacco Use: Low Risk  (09/05/2022)    Patient History     Smoking Tobacco Use: Never     Smokeless Tobacco Use: Never     Passive Exposure: Not on file      Relevant Medications:     No AC/AP use.    Allergies:     Allergies[2]         Neurological Examination:     GCS E4 - spontaneous/V5 - alert/oriented/M6 - obeys commands = 15  Pulmonary: Normal respiratory effort, no audible wheezing  Cardiovascular: No pedal edema, pulses 2+ in bilat lower extremities    Mental Status:   A&O x 3  Follows commands  Speech is clear without evidence of aphasia    Cranial Nerves:  -CN II: Visual fields full to bedside confrontation  -CN III,  IV, VI: Pupils equal, round, and reactive to light 3mm; extraocular movements intact; no ptosis, Conjugate gaze  -CN V: Facial sensation intact in V1 through V3 distributions  -CN VII: Face symmetric  - CN V+VII: Corneal NT  -CN VIII: Hearing intact to conversational speech  -CN IX, X: Palate elevates symmetrically; normal phonation. Cough/gag NT  -CN XI: Symmetric full strength of sternocleidomastoid and trapezius muscles  -CN XII: Tongue protrudes midline    Tenderness on palpation to midline Lspine  Motor: Muscle tone normal without spasticity or flaccidity. No atrophy.    Pronator drift absent                UE's:    Deltoid  C5 Bicep  C5-6 Tricep  C6-7 Wrist Ext  C6-7 Grip  C8 IO  C8-T1   Right 5 5 5 5 5 5    Left 5 5 5 5 5 5      LE's:    HF  L2-3 KE  L3-4 DF  L4-5 EHL  L5 PF  S1   Right 5 5 5 5 5    Left 5 5 5 5 5       Sensory: Light touch intact throughout     Reflexes:   Left Right   Brachioradialis 2+ 2+   Patella 2+ 2+     No Hoffmann's sign bilaterally  No Clonus bilaterally    Coordination: No tremors.      Radiology:      Imaging was personally reviewed and demonstrates the following:    CT LS 09/05/22  IMPRESSION:      Acute compression fracture of L3 left vertebral body with 40% height loss. No retropulsion/spinal canal compromise. Multilevel degeneration of L spine with severe loss of disc height at L5-S1.      Alexandra Jacobsen, MD  09/05/2022 12:35 PM     Attestation/Disclaimers:     Case discussed with Lowella Fairy, MD.   Neurosurgery attending Dr. [name], notified at 09/05/22 1:03 PM, who formulated the medical decision making in its entirety and will document separately above.    My above findings and recommendations were also communicated with Dr. Raynald Kemp, Valla Leaver, MD   Signed by: Ninfa Meeker, NP     I consulted on Alexandra Graves on 09/05/2022 at 1:03 PM.  She presents to the hospital with complaints of low back pain.  On exam she is awake alert and oriented.  Cranial nerves II through XII are intact.  Her strength station intact throughout.  I personally reviewed her CT of the lumbar spine which demonstrates an L3 compression fracture with no significant angulation or loss of height.  No surgery is indicated for this.  Recommend a brace for 6 weeks at all times when sitting or standing upright.  She should follow-up in clinic in 6 weeks with flex ex x-rays of the lumbar spine.  I developed the treatment plan and I am personally responsible for all of the decision making.  Agree with above note.    Paulette Blanch, MD, PhD               [1]   Past Medical History:  Diagnosis Date    Hypertension     Osteoporosis    [2] No Known Allergies

## 2022-09-05 NOTE — ED Triage Notes (Signed)
Pt reports left sided hip and low back pain x months but states recently, she has been unable to ambulate longer than a "few minutes" pt states tylenol at home with no relief. Left hip replacement 6 years ago. Pt denies left leg numbness/tingling. Denies loss of bowel/bladder control. Axo x4. - thinners.

## 2022-09-06 ENCOUNTER — Other Ambulatory Visit: Payer: Self-pay

## 2022-09-06 NOTE — Progress Notes (Signed)
Ambulatory Care Management Post-Discharge Support     Care Coordinator made a second attempt  to contact the patient at [phone] to assist in scheduling a follow up appointment. Patient did not answer and writer left a message requesting a call back.        Earney Navy, LPN  Care Coordinator, Ambulatory Care Management   [facility]  [address] [place], [place] [address]

## 2022-09-07 ENCOUNTER — Other Ambulatory Visit: Payer: Self-pay | Admitting: Nurse Practitioner

## 2022-09-07 DIAGNOSIS — S32030G Wedge compression fracture of third lumbar vertebra, subsequent encounter for fracture with delayed healing: Secondary | ICD-10-CM | POA: Insufficient documentation

## 2022-09-07 NOTE — Addendum Note (Signed)
Addended by: Tawni Millers MAY on: 09/07/2022 03:43 PM     Modules accepted: Orders

## 2022-09-20 ENCOUNTER — Other Ambulatory Visit: Payer: Self-pay | Admitting: Physician Assistant

## 2022-09-20 DIAGNOSIS — S32030G Wedge compression fracture of third lumbar vertebra, subsequent encounter for fracture with delayed healing: Secondary | ICD-10-CM

## 2022-09-21 NOTE — Progress Notes (Signed)
[facility]  Follow-up Note    Date: 09/22/2022  Patient Name: Alexandra Graves, Alexandra Graves  MRN: [id number]    Attending: Dr. [name]  Referring MD: Referring, Not On File,*   Primary Care MD: Pcp, None, MD     Chief Complaint     Chief Complaint   Patient presents with    Follow-up       HPI     Alexandra Graves is a 89 y.o. female with a PMH of HTN, left hip replacement, and breast CA who presented on 09/05/22 with worsening left hip pain that radiates to the low back. CT LS demonstrating acute L3 compression fx with 40% LOH without RP. She returns today for follow-up.  She is accompanied by her daughter-in-law today.    She rates her pain an 8/10 located in the low back.  She reports that she has had this pain since November 2023.  She has difficulty walking and imbalance.  She does report a history of polio when she was younger.  So she has a constant baseline of back stiffness but no neurologic deficit from polio.  She will have pain in the left hip and radiating into the left anterior thigh.  She has not able to stand longer than 2 to 3 minutes without having severe pain.  She feels weakness in the legs.  She has done physical therapy in the past without relief.    Impression     89 y.o. female presenting in follow-up for L3 compression fracture.  On exam, she does not have any tenderness to palpation.  She does complain of radiating left leg pain to the anterior thigh. CT lumbar spine wo shows multilevel degeneration with a L3 compression fracture.    Plan   I had an extensive discussion with patient today regarding the patient's condition. I reviewed the patient's imaging with her today.     My recommendations at this time include:  MRI lumbar spine without contrast  Refill medication oxycodone and naproxen.  Future refills will need to be obtained from pain management or primary care.  I have placed a referral for [facility] as needed.  Follow-up with Dr. [name] after MRI    I  answered all of the patient's questions to the best of my ability. The patient was encouraged to contact the office with any questions or concerns.     Medical History   Medical History[1]     Surgical History     Past Surgical History:   Procedure Laterality Date    open heart surgery      Patient says about 6-7 years ago     TOTAL HIP ARTHROPLASTY  2018        Family History     Family History   Family history unknown: Yes        Social History     Social History     Tobacco Use    Smoking status: Never    Smokeless tobacco: Never   Substance Use Topics    Alcohol use: Yes     Comment: wine       Current Medications   Current Medications[2]     Allergies   Allergies[3]     Review of Systems   See HPI for pertinent ROS.     Vitals     Vitals:    09/22/22 1343   BP: 119/78   Pulse: 79       Physical Examination  General:  Well developed, well nourished, no apparent distress  Neck:  Supple, no JVD, no apparent lymphadenopathy  HEENT:  Head normocephalic, atraumatic, no obvious lesions in ear, nose or throat  Chest:  Equal chest rise.  No audible wheezing.   Skin:  No obvious lesions or scars  Extremities:  Without clubbing or cyanosis    Neurologic Exam:  Awake, alert, orientedx3  Speech clear and fluent  Attention span normal  Motor:   Arms:      Deltoid  Bicep Tricep WE WF Grip IO   Right 5 5 5 5 5 5 5    Left  5 5 5 5 5 5 5        Legs:      HF KE KF DF PF EHL   Right 5 5 5 5 5 5    Left  5 5 5 5 5 5      Light touch and pinprick intact in all 4 extremities   DTRs:      Biceps Triceps Brachiorad Patellar Ankle   Right 1+ 1+ 1+ 1+ 1+   Left  1+ 1+ 1+ 1+ 1+     Hoffman's sign: negative bilaterally  Clonus: negative bilaterally  Gait intact  unable to tandem gait  unable to toe and heel walk    No TTP midline lumbar spine     Radiology Interpretation   XR Lumbar Spine AP And Lateral    Result Date: 09/05/2022   Stable L3 fracture Trilby Drummer, MD 09/05/2022 4:34 PM    CT Hip Left WO Contrast    Result Date:  09/05/2022  1.No CT evidence for periprosthetic fracture or osteolysis of the left hip arthroplasty. However, osteopenia limits evaluation. If there is persistent clinical concern for occult fracture or osteolysis, further evaluation with prosthetic protocol MRI is recommended. Sandie Ano, MD 09/05/2022 12:40 PM    CT Lumbar Spine without Contrast    Result Date: 09/05/2022   Acute osteoporotic compression fracture of L3 left vertebral body with 40% height loss. No retropulsion/spinal canal compromise. Leota Jacobsen, MD 09/05/2022 12:35 PM      I reviewed the patient's imaging myself and with the patient.     Harl Bowie, PA-C   [facility] APP  [address]  [place], [place] [address]  [address], [address]  [place], [place] [address]  T [phone] F [phone]    ===============================================================     N.B.: This note was generated by the Epic EMR system/ Dragon speech recognition and may contain inherent errors or omissions not intended by the user. Grammatical errors, random word insertions, deletions, pronoun errors and incomplete sentences are occasional consequences of this technology due to software limitations. Not all errors are caught or corrected. If there are questions or concerns about the content of this note or information contained within the body of this dictation they should be addressed directly with the author for clarification.         [1]   Past Medical History:  Diagnosis Date    Hypertension     Osteoporosis    [2]   Current Outpatient Medications:     amLODIPine (NORVASC) 5 MG tablet, , Disp: , Rfl:     aspirin EC 81 MG EC tablet, Take 1 tablet (81 mg) by mouth daily, Disp: , Rfl:     Biotin 1000 MCG Chew Tab, Chew by mouth, Disp: , Rfl:     Bystolic 5 MG tablet, , Disp: ,  Rfl:     ciclopirox (PENLAC) 8 % solution, , Disp: , Rfl:     Denosumab (PROLIA SC), Inject into the skin, Disp: , Rfl:     Lidocaine  (Salonpas Pain Relieving) 4 % Patch, Apply 1 patch topically every 12 (twelve) hours as needed (Back Pain) 1 patch every 24 hours., Disp: 15 patch, Rfl: 0    losartan-hydrochlorothiazide (HYZAAR) 100-25 MG per tablet, , Disp: , Rfl:     omeprazole (PriLOSEC) 40 MG capsule, , Disp: , Rfl:     polyethylene glycol (MIRALAX) 17 g packet, Take 17 g by mouth daily as needed (Constipation), Disp: 14 packet, Rfl: 0    propafenone (RYTHMOL) 150 MG tablet, , Disp: , Rfl:     spironolactone (ALDACTONE) 25 MG tablet, , Disp: , Rfl:     losartan-hydrochlorothiazide (HYZAAR) 100-25 MG per tablet, , Disp: , Rfl:     naproxen (NAPROSYN) 500 MG tablet, Take 1 tablet (500 mg) by mouth 2 (two) times daily with meals, Disp: 60 tablet, Rfl: 0    oxyCODONE (ROXICODONE) 5 MG immediate release tablet, Take 1 tablet (5 mg) by mouth every 6 (six) hours as needed for Pain Supervising Physician: Jerilynn Mages, MD, DEA: [id number], Disp: 20 tablet, Rfl: 0  [3] No Known Allergies

## 2022-09-22 ENCOUNTER — Encounter: Payer: Self-pay | Admitting: Physician Assistant

## 2022-09-22 ENCOUNTER — Ambulatory Visit: Payer: Medicare Other | Attending: Neurological Surgery | Admitting: Physician Assistant

## 2022-09-22 VITALS — BP 119/78 | HR 79 | Ht 60.0 in | Wt 103.0 lb

## 2022-09-22 DIAGNOSIS — G8929 Other chronic pain: Secondary | ICD-10-CM

## 2022-09-22 DIAGNOSIS — M545 Low back pain, unspecified: Secondary | ICD-10-CM | POA: Insufficient documentation

## 2022-09-22 DIAGNOSIS — S32030G Wedge compression fracture of third lumbar vertebra, subsequent encounter for fracture with delayed healing: Secondary | ICD-10-CM

## 2022-09-22 MED ORDER — NAPROXEN 500 MG PO TABS
500.0000 mg | ORAL_TABLET | Freq: Two times a day (BID) | ORAL | 0 refills | Status: DC
Start: 2022-09-22 — End: 2022-10-30

## 2022-09-22 MED ORDER — OXYCODONE HCL 5 MG PO TABS
5.0000 mg | ORAL_TABLET | Freq: Four times a day (QID) | ORAL | 0 refills | Status: DC | PRN
Start: 2022-09-22 — End: 2022-10-18

## 2022-09-29 ENCOUNTER — Telehealth: Payer: Self-pay | Admitting: Neurological Surgery

## 2022-09-29 NOTE — Telephone Encounter (Signed)
Pt's daughter in law Dewayne Hatch called requesting for a sooner appt. Per Dewayne Hatch, pt's MRI is scheduled on 8/27 and she is requesting right after getting the MRI because pt is having pain.       Thanks,

## 2022-10-03 ENCOUNTER — Ambulatory Visit: Payer: Medicare Other | Attending: Physician Assistant

## 2022-10-03 DIAGNOSIS — S32030G Wedge compression fracture of third lumbar vertebra, subsequent encounter for fracture with delayed healing: Secondary | ICD-10-CM | POA: Insufficient documentation

## 2022-10-05 ENCOUNTER — Other Ambulatory Visit: Payer: Self-pay | Admitting: Neurological Surgery

## 2022-10-05 NOTE — Telephone Encounter (Signed)
Patient daughter in law Alexandra Graves,Alexandra Graves called in to advised patient will be out of her Oxycodone 5mg  in 2day s-she also stated the patient takes 2 tablets a day -she will need a refill being that her appt. Is scheduled for October with Dr. [name] -she can be reached at [phone].

## 2022-10-10 ENCOUNTER — Other Ambulatory Visit: Payer: Self-pay | Admitting: Physician Assistant

## 2022-10-18 ENCOUNTER — Telehealth: Payer: Self-pay | Admitting: Neurological Surgery

## 2022-10-18 ENCOUNTER — Ambulatory Visit: Payer: Medicare Other | Attending: Neurological Surgery | Admitting: Physician Assistant

## 2022-10-18 ENCOUNTER — Encounter: Payer: Self-pay | Admitting: Physician Assistant

## 2022-10-18 VITALS — BP 119/72 | HR 67 | Temp 98.1°F | Resp 16 | Ht 60.0 in | Wt 105.8 lb

## 2022-10-18 DIAGNOSIS — M5416 Radiculopathy, lumbar region: Secondary | ICD-10-CM

## 2022-10-18 DIAGNOSIS — S32030G Wedge compression fracture of third lumbar vertebra, subsequent encounter for fracture with delayed healing: Secondary | ICD-10-CM

## 2022-10-18 MED ORDER — OXYCODONE HCL 5 MG PO TABS
5.0000 mg | ORAL_TABLET | Freq: Four times a day (QID) | ORAL | 0 refills | Status: AC | PRN
Start: 2022-10-18 — End: 2022-10-23

## 2022-10-18 NOTE — Telephone Encounter (Signed)
Attempted to call patient's daughter in law in regards to [facility] from Dr. [name] to Dr. Deloria Lair. No answer, LVMTCB. Ok per APPs. Patient did not want to wait for Dr. [name] to return as patient is a lot of pain. L3 FX.

## 2022-10-18 NOTE — Progress Notes (Signed)
[facility]  Follow-up Note    Date: 10/18/2022  Patient Name: Alexandra Graves, Alexandra Graves  MRN: [id number]    Attending: Dr. [name]  Referring MD: Referring, Not On File,*   Primary Care MD: Pcp, None, MD     Chief Complaint     Chief Complaint   Patient presents with    Follow-up     Patient here for review of MRI -in chart. Patient has not gone to comprehensive pain clinic. Patient   States she is here for an RX for pain. Patient has pain in her lower back and hip area.     Back Pain     HPI     Alexandra Graves is a 89 y.o.  female with a PMH of HTN, left hip replacement, and breast CA who presented on 09/05/22 with worsening left hip pain that radiates to the low back. CT LS demonstrating acute L3 compression fx with 40% LOH without RP. She returns today for follow-up.  She is accompanied by her daughter-in-law today.     At her last visit on 09/22/22, she reported severe pain. She has had this pain since November 2023.  She has difficulty walking and imbalance.  She does report a history of polio when she was younger.  So she has a constant baseline of back stiffness but no neurologic deficit from polio.  She will have pain in the left hip and radiating into the left anterior thigh.  She has not able to stand longer than 2 to 3 minutes without having severe pain.  She feels weakness in the legs.  She has done physical therapy in the past without relief. Today, she reports severe pain. She was not able to get into see pain management for chronic pain medication with [facility].     Impression     89 y.o. female presenting in follow-up for L3 compression fracture.  On exam, she does not have any tenderness to palpation.  She does complain of radiating left leg pain to the anterior thigh. MRI of the lumbar spine without contrast shows L4-L5, L5-S1 anterolisthesis, subacute L3 compression fracture with 50% height loss, left foraminal stenosis at L2-L3, there is bilateral foraminal narrowing at L3-L4, central  canal stenosis at L4-L5.     Plan   I had an extensive discussion with patient today regarding the patient's condition. I reviewed the patient's imaging with her today.     My recommendations at this time include:  1 time refill for oxycodone, further refills need to be obtained from PCP or pain management.   Follow-up with Dr. Deloria Lair to discuss surgical options, Ok for transfer of care.     I answered all of the patient's questions to the best of my ability. The patient was encouraged to contact the office with any questions or concerns.     Medical History   Medical History[1]     Surgical History     Past Surgical History:   Procedure Laterality Date    open heart surgery      Patient says about 6-7 years ago     TOTAL HIP ARTHROPLASTY  2018        Family History     Family History   Family history unknown: Yes        Social History     Social History     Tobacco Use    Smoking status: Never    Smokeless tobacco: Never   Substance Use Topics  Alcohol use: Yes     Comment: wine       Current Medications   Current Medications[2]     Allergies   Allergies[3]     Review of Systems   See HPI for pertinent ROS.     Vitals     Vitals:    10/18/22 0948   BP: 119/72   Pulse: 67   Resp: 16   Temp: 98.1 F (36.7 C)       Physical Examination     General:  Well developed, well nourished, no apparent distress  Neck:  Supple, no JVD, no apparent lymphadenopathy  HEENT:  Head normocephalic, atraumatic, no obvious lesions in ear, nose or throat  Chest:  Equal chest rise.  No audible wheezing.   Skin:  No obvious lesions or scars  Extremities:  Without clubbing or cyanosis    Neurologic Exam:  Awake, alert, orientedx3  Speech clear and fluent  Attention span normal  Motor:              Arms:       Deltoid  Bicep Tricep WE WF Grip IO   Right 5 5 5 5 5 5 5    Left  5 5 5 5 5 5 5                   Legs:       HF KE KF DF PF EHL   Right 5 5 5 5 5 5    Left  5 5 5 5 5 5       Light touch and pinprick intact in all 4 extremities    DTRs:       Biceps Triceps Brachiorad Patellar Ankle   Right 1+ 1+ 1+ 1+ 1+   Left  1+ 1+ 1+ 1+ 1+      Hoffman's sign: negative bilaterally  Clonus: negative bilaterally  Gait intact  unable to tandem gait  unable to toe and heel walk     TTP midline lumbar spine.     Radiology Interpretation   MRI Lumbar Spine WO Contrast    Result Date: 10/06/2022  1.Subacute L3 superior endplate compression fracture with associated marrow edema. 2.Multilevel degenerative changes of the lumbar spine, as detailed above. Central canal stenosis is most pronounced at L2-L3. Foraminal narrowing is most pronounced on the left at L2-L3 and on the right at L5-S1. [name], MD 10/06/2022 6:18 AM      I reviewed the patient's imaging myself and with the patient.     Harl Bowie, PA-C   [facility] APP  [address]  [place], [place] [address]  [address]  [place], [place] [address]  T [phone] F [phone]    ===============================================================     N.B.: This note was generated by the Epic EMR system/ Dragon speech recognition and may contain inherent errors or omissions not intended by the user. Grammatical errors, random word insertions, deletions, pronoun errors and incomplete sentences are occasional consequences of this technology due to software limitations. Not all errors are caught or corrected. If there are questions or concerns about the content of this note or information contained within the body of this dictation they should be addressed directly with the author for clarification.         [1]   Past Medical History:  Diagnosis Date    Hypertension     Osteoporosis    [2]   Current Outpatient Medications:  amLODIPine (NORVASC) 5 MG tablet, , Disp: , Rfl:     aspirin EC 81 MG EC tablet, Take 1 tablet (81 mg) by mouth daily, Disp: , Rfl:     Biotin 1000 MCG Chew Tab, Chew by mouth, Disp: , Rfl:     Bystolic 5 MG tablet, , Disp: , Rfl:      omeprazole (PriLOSEC) 40 MG capsule, , Disp: , Rfl:     ciclopirox (PENLAC) 8 % solution, , Disp: , Rfl:     Denosumab (PROLIA SC), Inject into the skin, Disp: , Rfl:     Lidocaine (Salonpas Pain Relieving) 4 % Patch, Apply 1 patch topically every 12 (twelve) hours as needed (Back Pain) 1 patch every 24 hours., Disp: 15 patch, Rfl: 0    losartan-hydrochlorothiazide (HYZAAR) 100-25 MG per tablet, Take 1 tablet by mouth daily, Disp: , Rfl:     losartan-hydrochlorothiazide (HYZAAR) 100-25 MG per tablet, , Disp: , Rfl:     naproxen (NAPROSYN) 500 MG tablet, Take 1 tablet (500 mg) by mouth 2 (two) times daily with meals, Disp: 60 tablet, Rfl: 0    polyethylene glycol (MIRALAX) 17 g packet, Take 17 g by mouth daily as needed (Constipation), Disp: 14 packet, Rfl: 0    propafenone (RYTHMOL) 150 MG tablet, , Disp: , Rfl:     spironolactone (ALDACTONE) 25 MG tablet, , Disp: , Rfl:   [3] No Active Allergies

## 2022-10-24 ENCOUNTER — Telehealth: Payer: Self-pay

## 2022-10-24 NOTE — Telephone Encounter (Signed)
Attempted to call patient to confirm appt with XR.    Patient stated that he will obtain XR on the day on the 2nd floor prior to appt.          3:22PM ET  October 24, 2022   Alexandra Graves, [place]

## 2022-10-25 ENCOUNTER — Other Ambulatory Visit: Payer: Self-pay | Admitting: Neurological Surgery

## 2022-10-25 ENCOUNTER — Encounter: Payer: Self-pay | Admitting: Neurological Surgery

## 2022-10-25 ENCOUNTER — Ambulatory Visit
Admission: RE | Admit: 2022-10-25 | Discharge: 2022-10-25 | Disposition: A | Discharge status: Home / Self Care | Payer: Medicare Other | Source: Ambulatory Visit | Attending: Physician Assistant | Admitting: Physician Assistant

## 2022-10-25 ENCOUNTER — Ambulatory Visit: Payer: Medicare Other | Attending: Neurological Surgery | Admitting: Neurological Surgery

## 2022-10-25 VITALS — BP 121/72 | HR 61 | Resp 18 | Ht 60.0 in | Wt 104.0 lb

## 2022-10-25 DIAGNOSIS — M5416 Radiculopathy, lumbar region: Secondary | ICD-10-CM | POA: Insufficient documentation

## 2022-10-25 DIAGNOSIS — S32030G Wedge compression fracture of third lumbar vertebra, subsequent encounter for fracture with delayed healing: Secondary | ICD-10-CM | POA: Insufficient documentation

## 2022-10-25 MED ORDER — OXYCODONE HCL 5 MG PO TABS
2.5000 mg | ORAL_TABLET | ORAL | 0 refills | Status: DC | PRN
Start: 2022-10-25 — End: 2022-11-02

## 2022-10-25 NOTE — Progress Notes (Signed)
[facility]  Follow up Note    HPI     Chief Complaint   Patient presents with    Initial Consult     ED 09/05/22 [name]) - acute L4 comp fx w/ 40% LoH  MRI LS 10/03/22 - .Subacute L3 superior endplate compression fracture  XR LS 10/25/22       Alexandra Graves is a 89 y.o. female with PMH HTN, left hip replacement and breast cancer who is seen in the ED on 09/05/2022 with worsening left hip pain that radiates to the low back.  She states that this pain has been going on since last November.  However she states it got worse about a month back.  She went to the ER and saw Dr. [name] at that time and was found to have L3 compression fracture.  She has continued to trial PT and has found no improvement.  She recently saw [name] and MRI was done which showed a subacute L3 compression fracture as well as lumbar stenosis.  She has not had pain management has not tried steroid injections.  She has 2 complaints of pain.  She has mechanical back pain.  But she also has pain rating down her left leg down her anterior thigh.  Her pain worsens with walking and improves with lying down.  She also complains of hip pain and has a previous hip fusion.      Physical Examination   VITAL SIGNS:     Vitals:    10/25/22 1114   BP: 121/72   Pulse: 61   Resp: 18          Neurologic Exam  Spine  No visible deformity  Full ROM  Non-tender to palpation    Mental Status    Alert   Speech clear and fluent     Cranial Nerves    CN III, IV, VI : Pupils are equal, round, and reactive to light.                         Extraocular motions are normal.   CN VII: Facial expression full, symmetric.   CN VIII: Hearing intact bilaterally  CN XI: Shoulder shrug symmetric    Motor Exam   Overall muscle tone: normal  Strength:              UE's:    Deltoid  C5 Bicep  C5-6 Tricep  C6-7 Wrist Ext  C6-7 Wrist   Flex  C7 Grip  C8 IO  C8-T1   Left 5 5 5 5 5 5 5    Right 5 5 5 5 5 5 5      LE's:    HF  L2-3 KE  L3-4 KF  L5-S1 DF  L4-5  PF  S1 EHL  L5   Left 5 5 5 5 5 5    Right 5 5 5 5 5 5      Sensation  Intact to light touch in upper and lower extremities    Reflexes   Left Right   Biceps  2+ 2+   Brachioradialis 2+ 2+   Patella 2+ 2+   Achilles 2+ 2+     Gait  Ambulates with limp and hunched over    Review of Systems   Review of Systems  Constitutional: negative for fever or chills.  HENT: Negative for tinnitus or rhinorrhea   Eyes: Negative for visual disturbance.   Musculoskeletal: Negative for gait problem.  Negative for neck pain. Negative for back pain.   Skin: Negative for wounds.  Neurological: no history of seizures.  Respiratory: Negative for cough or wheezing  Endocrine: Negative for cold or heat intolerance   GI: Negative for constipation or diarrhea     Radiology Interpretation   XR AP & Lateral, Lumbar Spine    Result Date: 10/26/2022  There is a compression fracture of the L3 vertebral body with moderate vertebral body height loss. The degree of height loss is mildly progressive compared to the prior plain radiographs and similar in degree compared to the prior MRI examination, allowing for differences in modality. [name], MD 10/26/2022 8:30 AM    MRI Lumbar Spine WO Contrast    Result Date: 10/06/2022  1.Subacute L3 superior endplate compression fracture with associated marrow edema. 2.Multilevel degenerative changes of the lumbar spine, as detailed above. Central canal stenosis is most pronounced at L2-L3. Foraminal narrowing is most pronounced on the left at L2-L3 and on the right at L5-S1. [name], MD 10/06/2022 6:18 AM      The images above were personally reviewed and discussed in detail with the patient.       Impression   Alexandra Graves is a 89 y.o. female PMH HTN, left hip replacement and breast cancer, prior cardiac surgery on ASA who presents with mechanical back pain and left L3 radiculopathy.  MRI demonstrates a subacute L3 compression fracture as well as moderate central canal stenosis at L2-3 as well as  multilevel facet arthropathy.      Plan   The options of management were discussed and the patient and family expressed good understanding.  Given that she continues to have low back pain in the setting of L3 fracture we will plan for an L3 kyphoplasty.  Will also refer her to pain management for ESI as she has failed physical therapy.       Referral for pain management for ESI.  Refill for 2.5 mg oxycodone  Plan for L3 kyphoplasty   - Potential benefits and outcomes of the procedure include improvement in the current condition, decrease pain, prevent further neurological deficits, and prevent progression of vertebral compression.  Potential risks and complications associated with the procedure include bleeding, infection, damage to surrounding structures including nerves, permanent or temporary neurological deficits, paralysis, risk of allergic reaction to bone cement, risk of bone cement leakage, paralysis, incomplete relief of symptoms, need for further surgery, and risk of anesthesia including coma and death.  Alternatives to the proposed procedure are no surgery, traditional open surgery, and medical management.      Follow-up   Follow-up 4 weeks after L3 kyphoplasty and ESI    All relevant and clinical information was transcribed by me, Vanessa Kick,  acting as a scribe for Dr Deloria Lair.  Maida Sale, PA-C    Cindra Presume MD PGY 4    I, Rosemarie Beath, MD, personally performed the services documented. Vanessa Kick, PA is scribing for me for this patient. This note and the patient instructions accurately reflect work and decisions made by me, Rosemarie Beath, MD.  Marlaine Hind, MD         *N.B.: This note was generated by the Epic EMR system/ Dragon speech recognition and may contain inherent errors or omissions not intended by the user. Grammatical errors, random word insertions, deletions, pronoun errors and incomplete sentences are occasional consequences of this technology due to software  limitations. Not all errors are caught or corrected. If  there are questions or concerns about the content of this note or information contained within the body of this dictation they should be addressed directly with the author for clarification.*

## 2022-10-30 ENCOUNTER — Ambulatory Visit (FREE_STANDING_LABORATORY_FACILITY): Payer: Medicare Other | Admitting: Family

## 2022-10-30 ENCOUNTER — Encounter (INDEPENDENT_AMBULATORY_CARE_PROVIDER_SITE_OTHER): Payer: Self-pay | Admitting: Family

## 2022-10-30 ENCOUNTER — Ambulatory Visit: Payer: Medicare Other | Attending: Family | Admitting: Family

## 2022-10-30 VITALS — BP 111/68 | HR 67 | Ht 60.0 in | Wt 103.0 lb

## 2022-10-30 DIAGNOSIS — Z9289 Personal history of other medical treatment: Secondary | ICD-10-CM | POA: Insufficient documentation

## 2022-10-30 DIAGNOSIS — R768 Other specified abnormal immunological findings in serum: Secondary | ICD-10-CM

## 2022-10-30 DIAGNOSIS — D539 Nutritional anemia, unspecified: Secondary | ICD-10-CM | POA: Insufficient documentation

## 2022-10-30 DIAGNOSIS — I10 Essential (primary) hypertension: Secondary | ICD-10-CM

## 2022-10-30 DIAGNOSIS — Z01818 Encounter for other preprocedural examination: Secondary | ICD-10-CM

## 2022-10-30 DIAGNOSIS — K219 Gastro-esophageal reflux disease without esophagitis: Secondary | ICD-10-CM

## 2022-10-30 DIAGNOSIS — Z951 Presence of aortocoronary bypass graft: Secondary | ICD-10-CM | POA: Insufficient documentation

## 2022-10-30 DIAGNOSIS — I2510 Atherosclerotic heart disease of native coronary artery without angina pectoris: Secondary | ICD-10-CM | POA: Insufficient documentation

## 2022-10-30 DIAGNOSIS — M5416 Radiculopathy, lumbar region: Secondary | ICD-10-CM

## 2022-10-30 DIAGNOSIS — N1832 Chronic kidney disease, stage 3b: Secondary | ICD-10-CM

## 2022-10-30 LAB — CBC
Absolute nRBC: 0 10*3/uL (ref <=0.00)
Hematocrit: 33.7 % — ABNORMAL LOW (ref 34.7–43.7)
Hemoglobin: 11 g/dL — ABNORMAL LOW (ref 11.4–14.8)
MCH: 33.1 pg (ref 25.1–33.5)
MCHC: 32.6 g/dL (ref 31.5–35.8)
MCV: 101.5 fL — ABNORMAL HIGH (ref 78.0–96.0)
MPV: 9.7 fL (ref 8.9–12.5)
Platelet Count: 317 10*3/uL (ref 142–346)
RBC: 3.32 10*6/uL — ABNORMAL LOW (ref 3.90–5.10)
RDW: 13 % (ref 11–15)
WBC: 5.39 10*3/uL (ref 3.10–9.50)
nRBC %: 0 /100 WBC (ref <=0.0)

## 2022-10-30 LAB — BASIC METABOLIC PANEL
Anion Gap: 12 (ref 5.0–15.0)
BUN: 20 mg/dL (ref 7–21)
CO2: 20 mEq/L (ref 17–29)
Calcium: 10.7 mg/dL — ABNORMAL HIGH (ref 7.9–10.2)
Chloride: 103 mEq/L (ref 99–111)
Creatinine: 1 mg/dL (ref 0.4–1.0)
GFR: 53.2 mL/min/{1.73_m2} — ABNORMAL LOW (ref >=60.0)
Glucose: 91 mg/dL (ref 70–100)
Hemolysis Index: 30 Index
Potassium: 3.9 mEq/L (ref 3.5–5.3)
Sodium: 135 mEq/L (ref 135–145)

## 2022-10-30 LAB — ANTIBODY IDENTIFICATION,  REFLEX: Antibody ID: POSITIVE

## 2022-10-30 LAB — PRESURGICAL FORM, REFLEX

## 2022-10-30 LAB — PRE-PROCEDURE ANEMIA EVALUATION REFLEX PANEL
Creatinine: 1 mg/dL (ref 0.4–1.0)
Ferritin: 36.2 ng/mL (ref 4.60–204.00)
GFR: 53.2 mL/min/{1.73_m2} — ABNORMAL LOW (ref >=60.0)
Hemolysis Index: 7 Index
Iron Saturation: 19 % (ref 15–50)
Iron: 59 ug/dL (ref 32–157)
TIBC: 310 ug/dL (ref 265–497)
TSH: 1.01 u[IU]/mL (ref 0.35–4.94)
UIBC: 251 ug/dL (ref 126–382)

## 2022-10-30 LAB — LAB USE ONLY - GOLD SST HOLD TUBE

## 2022-10-30 LAB — TYPE AND SCREEN
ABO Rh: A NEG
Antibody Screen: POSITIVE

## 2022-10-30 NOTE — PEC In-Person Visit (H&P) (Addendum)
Pre-Anesthesia Evaluation     Pre-op Interview visit requested by:   Reason for pre-op interview visit: Patient anticipating L3 KYPHOPLASTY procedure.         History of Present Illness/Summary:  Patient presents to the [facility] clinic for a pre-operative evaluation.    Assessment/Plan:    1.  Encounter for pre-operative examination [Z01.818]    Orders Placed This Encounter   Procedures    Culture Presurgical Surveillance Methicillin Susceptible Staphylococcus aureus and Methicillin Resistant Staphylococcus aureus (MSSA + MRSA)    Culture Presurgical Surveillance Methicillin Susceptible Staphylococcus aureus and Methicillin Resistant Staphylococcus aureus (MSSA + MRSA)    Type and Screen    Basic Metabolic Panel    Pre-Procedure Anemia Evaluation (Order)    CBC without Differential    Gold SST Hold Tube    Blood Type Confirmation    Presurgical Form, Reflex    Pre-Procedure Anemia Evaluation, Reflex Panel    Antibody Identification, Reflex     MRSA/MSSA negative.     2.  Surgical Diagnosis:  Lumbar radiculopathy [M54.16]    3.  Hypertension  Controlled with current regimen  Will update renal function and electrolytes today     4.  CAD s/p CABG  She is followed by cardiology - Dr. Carolin Guernsey (in [place])  She is on ASA 81 mg. Epic message sent to surgical team to notify that she has not stopped it yet.   She is able to achieve >4 METS without any cardiopulmonary symptoms. No further cardiac testing warranted at this time.      5. CKD Stage 3b  6. Hyperparathyroidism   She is followed by nephrology - Dr. Roland Rack  Cr 1.14 and eGFR 46 04/2022   Cr 1.0 and eGFR 53 today, calcium 10.7.     7. GERD  Controlled with current regimen    8. Anemia  H/H today is 11/33.7. PPAE panel pending.     9. History of blood transfusion  T&S checked today and she is antibody positive.   IgG antibody of undetermined specificity present in plasma.              All clinically significant antibodies excluded.           Patient is at elevated risk  for perioperative CV complications 2/2 CAD, however, not prohibitive to proceeding with planned surgery or procedure.    Patient voiced understanding of all instructions.  All questions and concerns addressed at this time. This assessment will be conveyed to the surgery and anesthesiology teams & the patient will be evaluated the morning of surgery.  Education was provided to patient regarding CHG instructions, incentive spirometer use, preparing for surgery tips and pain management instructions.  The patient was given a Pre-Surgery packet containing Hibiclens solution and an incentive spirometer. Patient verbalizes understanding of ERAS education and instructions.        Problem List:  Medical Problems       Hospital Problem List  Date Reviewed: 10/30/2022   None        Non-Hospital Problem List  Date Reviewed: 10/30/2022            ICD-10-CM Priority Class Noted Diagnosed    Left hip pain M25.552   10/09/2018     Closed compression fracture of L3 lumbar vertebra with delayed healing, subsequent encounter S32.030G   09/07/2022     Lumbar radiculopathy M54.16   10/25/2022     Essential hypertension I10   04/23/2019  Gastroesophageal reflux disease K21.9   04/23/2019     Hyperparathyroidism due to renal insufficiency N25.81   11/01/2021     Stage 3 chronic kidney disease N18.30   04/23/2019     Coronary artery disease involving native coronary artery of native heart without angina pectoris I25.10   10/30/2022     S/P CABG (coronary artery bypass graft) Z95.1   10/30/2022     History of blood transfusion Z92.89   10/30/2022     Macrocytic anemia D53.9   10/30/2022     Red blood cell antibody positive R76.8   10/31/2022     Overview Signed 10/31/2022 10:47 AM by Barth Kirks, FNP     IgG antibody of undetermined specificity present in plasma.                       All clinically significant antibodies excluded.                    Medical History   Diagnosis Date    Coronary artery disease involving native coronary artery of  native heart without angina pectoris 10/30/2022    Hypertension     Macrocytic anemia 10/30/2022    Osteoporosis      Past Surgical History[1]     Medication List            Accurate as of October 30, 2022 11:59 PM. Always use your most recent med list.                acetaminophen 500 MG tablet  Take 1 tablet (500 mg) by mouth  Commonly known as: TYLENOL  Medication Adjustments for Surgery: Take as needed     amLODIPine 5 MG tablet  Commonly known as: NORVASC  Medication Adjustments for Surgery: Take as prescribed     aspirin EC 81 MG EC tablet  Take 1 tablet (81 mg) by mouth daily  Medication Adjustments for Surgery: Stop now     Biotin 1000 MCG Chew  Chew by mouth  Medication Adjustments for Surgery: Hold day of surgery     Bystolic 5 MG tablet  Generic drug: nebivolol  Medication Adjustments for Surgery: Take as prescribed     Calcium Citrate-Vitamin D 315-5 MG-MCG Tabs  Take by mouth  Medication Adjustments for Surgery: Hold day of surgery     losartan-hydrochlorothiazide 100-25 MG per tablet  Commonly known as: HYZAAR  Medication Adjustments for Surgery: Hold day of surgery     omeprazole 40 MG capsule  Commonly known as: PriLOSEC  Medication Adjustments for Surgery: Take as prescribed     oxyCODONE 5 MG immediate release tablet  Take 0.5 tablets (2.5 mg) by mouth every 4 (four) hours as needed for Pain  Commonly known as: ROXICODONE  Medication Adjustments for Surgery: Take as needed     PROLIA SC  Inject into the skin  Medication Adjustments for Surgery: Hold day of surgery     propafenone 150 MG tablet  Commonly known as: RYTHMOL  Medication Adjustments for Surgery: Take as prescribed     saccharomyces boulardii 250 MG capsule  Take 1 capsule (250 mg) by mouth 2 (two) times daily  Commonly known as: FLORASTOR  Medication Adjustments for Surgery: Hold day of surgery     spironolactone 25 MG tablet  Commonly known as: ALDACTONE  Medication Adjustments for Surgery: Take as prescribed             Allergies[2]  Social  History     Occupational History    Not on file   Tobacco Use    Smoking status: Never    Smokeless tobacco: Never   Vaping Use    Vaping status: Never Used   Substance and Sexual Activity    Alcohol use: Yes     Comment: wine    Drug use: Never    Sexual activity: Not on file       Menstrual History:   LMP / Status  Postmenopausal     No LMP recorded. Patient is postmenopausal.    Tubal Ligation?  No valid surgical or medical questions entered.           Exam Scores:   SDB score  OSA Risk Category: No Risk        STBUR score       PONV score  Nausea Risk: SEVERE RISK    MST score  MST Score: 0    PEN-FAST score       Frailty score  CFS Score: 3    MICA       RCRI score  RCRI Score: 1    DASI  DASI Score: 15.45  METs Level: 4.64    EA-DIVA  EA-DIVA Score: 2    CHADsVasc            Visit Vitals  BP 111/68   Pulse 67   Ht 1.524 m (5')   Wt 46.7 kg (103 lb)   SpO2 97%   BMI 20.12 kg/m        Review of Systems   Constitutional: Negative.    HENT: Negative.     Eyes: Negative.    Respiratory: Negative.     Cardiovascular: Negative.    Gastrointestinal: Negative.    Genitourinary: Negative.    Musculoskeletal:  Positive for back pain.   Skin: Negative.    Neurological: Negative.    Endo/Heme/Allergies: Negative.    Psychiatric/Behavioral: Negative.         Physical Exam:  Mallampati: II  TM distance: 3 FB (4-6 cm)  Mouth opening: < 3 FB (< 4 cm)  Neck extension: full  Upper lip bite assessment: class I  (-) enlarged neck circumference    Dental comments: Posterior crowns  Nothing loose     Mental status: alert and oriented x3  Speech: normal    Heart rhythm: regular  no murmur:    Peripheral edema 1+  (-) a murmur    Breath sounds clear to auscultation bilaterally            Recent Labs   CBC (last 180 days) 10/30/22  0926   WBC 5.39   RBC 3.32*   Hemoglobin 11.0*   Hematocrit 33.7*   MCV 101.5*   MCH 33.1   MCHC 32.6   RDW 13   Platelet Count 317   MPV 9.7   nRBC % 0.0   Absolute nRBC 0.00      Recent Labs   BMP (last 180 days) 10/30/22  0926   Glucose 91   BUN 20   Creatinine 1.0  1.0   Sodium 135   Potassium 3.9   Chloride 103   CO2 20   Calcium 10.7*   Anion Gap 12.0   GFR 53.2*  53.2*         Recent Labs   Other (last 180 days) 10/30/22  0926   TSH 1.01   Iron 59   Ferritin 36.20   Iron  Saturation 19   Culture Staph and MRSA Surveillance No Methicillin Resistant Staphylococcus aureus and No Staphylococcus aureus isolated  No Methicillin Resistant Staphylococcus aureus and No Staphylococcus aureus isolated     Recent Labs   Type & Screen (last 34 days) 10/30/22  0926   ABO Rh A Neg   Antibody Screen Positive         Anesthesia Plan:  ASA 3   Anesthetic Options Discussed: General  Potential anesthesia/Perioperative problems: No Problems Identified    DNR status: Patient or surrogate requests that DNR status remain full code.  Discussed DNR status with: Patient      A discussion with regarding next steps to prepare for the procedure and the planned anesthesia care took place during today's visit.  I explained that the patient will meet with their anesthesiology providers on the DOS.  Discussed with Patient      Acceptability of blood products: Accepted  Use of blood products discussed with Patient                                                               [1]   Past Surgical History:  Procedure Laterality Date    EXPLORATORY LAPAROTOMY  2020    open heart surgery      Patient says about 6-7 years ago     TOTAL HIP ARTHROPLASTY  2018   [2] No Known Allergies

## 2022-10-31 ENCOUNTER — Encounter: Payer: Self-pay | Admitting: Anesthesiology

## 2022-10-31 ENCOUNTER — Ambulatory Visit
Admission: RE | Admit: 2022-10-31 | Discharge: 2022-10-31 | Disposition: A | Discharge status: Home / Self Care | Payer: Medicare Other | Source: Ambulatory Visit | Attending: Anesthesiology | Admitting: Anesthesiology

## 2022-10-31 VITALS — BP 129/79 | HR 61 | Temp 97.5°F | Resp 12 | Wt 104.2 lb

## 2022-10-31 DIAGNOSIS — M4726 Other spondylosis with radiculopathy, lumbar region: Secondary | ICD-10-CM | POA: Insufficient documentation

## 2022-10-31 DIAGNOSIS — M5416 Radiculopathy, lumbar region: Secondary | ICD-10-CM

## 2022-10-31 DIAGNOSIS — M48062 Spinal stenosis, lumbar region with neurogenic claudication: Secondary | ICD-10-CM | POA: Insufficient documentation

## 2022-10-31 DIAGNOSIS — R768 Other specified abnormal immunological findings in serum: Secondary | ICD-10-CM | POA: Insufficient documentation

## 2022-10-31 LAB — CULTURE PRESURGICAL SURVEILLANCE METHICILLIN SUSCEPTIBLE STAPHYLOCOCCUS AUREUS AND METHICILLIN RESISTANT STAPHYLOCOCCUS AUREUS (MSSA + MRSA)

## 2022-10-31 MED ORDER — METHYLPREDNISOLONE ACETATE 40 MG/ML IJ SUSP
40.0000 mg | Freq: Once | INTRAMUSCULAR | Status: AC
Start: 2022-10-31 — End: ?

## 2022-10-31 MED ORDER — BUPIVACAINE HCL (PF) 0.25 % IJ SOLN
2.0000 mL | Freq: Once | INTRAMUSCULAR | Status: AC
Start: 2022-10-31 — End: ?

## 2022-10-31 MED ORDER — IOHEXOL 240 MG/ML IJ SOLN
1.0000 mL | Freq: Once | INTRAMUSCULAR | Status: AC
Start: 2022-10-31 — End: ?

## 2022-10-31 NOTE — Patient Instructions (Signed)
[facility]  [facility]  [facility]  [address]  [place], [place] [address]  T: [phone]    What to expect    It is normal to feel improvement in pain for a few hours if local anesthetic used for the procedure.  If no local anesthetic used for the procedure, or once the local anesthetic wears off, you may feel worsening of your pain for a few days.  If you are tender at the injection site you may use an ice pack wrapped in a towel for twenty (20) minutes three to four times a day    The pain relief from the steroid typically takes 2 to 5 days to see an effect.    Instructions    No immersion in water for 24 hours (no bath, no hot tub, no Jacuzzi etc.)  May shower after 6 hours if desired    You may resume your normal diet and medication regimen unless directed otherwise by your provider.  If you are taking a blood thinner, ask when it is acceptable to restart, as this will vary depending upon the medication and the procedure you underwent    You may resume regular activities as your comfort level allows, but do not do more than you would on a typical day    You will have a discoloration from the skin cleanser used for the procedure in the area where your procedure was performed.  The current recommendation is to not wash the antiseptic off immediately following the procedure, unless it is causing problems like itching or rash.  The disinfectant used is typically colored blue or orange.  This is normal and you may wash it off after 2 hours if desired.    You may remove bandage that we placed over the injection site after 2 hours, if desired    Potential Side effects    Temporary side effects of steroids could include:  Redness in the face  Swelling in the extremities  Insomnia  Short term Increase in blood sugar level or blood pressure.  Changes in mood    Cataracts, Glaucoma, hair loss and osteopenia/osteoporosis have been  associated with long term or excessive use of steroids    If local anesthesia used for the procedure, you may have mild and temporary numbness or weakness.  This typically resolves in a few hours.  Please have someone assist you with ambulation, and do not drive during this time period, if you notice weakness or heaviness.  If you notice severe numbness or weakness please call the office (or if after hours go to emergency room for evaluation).  If the numbness or weakness does not resolve after 12 hours please call the office.    When to call the office    Please call the office for the following:  Temperature greater than 100.3 F  Severe headache  Severe back pain that does not go away with over-the-counter medications (or if after hours go to emergency room for evaluation).   New onset numbness or weakness that is progressively getting worse (or if after hours go to emergency room for evaluation).   Redness at the injection site that is getting bigger, or discharge from the injection site

## 2022-10-31 NOTE — Procedures (Signed)
[facility]  [facility]  [facility]  [address]  [place], [place] [address]  T: [phone] F: [phone]    Alexandra Graves   DOB: [birth year]   MRN: [id number]   Date of Service: October 31, 2022      Referring Physician: Rosemarie Beath, MD  Pain Management Physician: Karmen Bongo, MD  PCP: Barth Kirks, FNP    Procedure Note:    Diagnosis:   Lumbar Radiculopathy  Spinal Stenosis with Neurogenic Claudication, Lumbar    Procedures:   Right L3-4 Transforaminal Epidural Steroid Injection with Fluoroscopic Guidance  Left  L4-5 Transforaminal Epidural Steroid Injection with Fluoroscopic Guidance    Anesthesia: Local    Indications:     Alexandra Graves is a 89 yo female with acute on chronic, moderate to severe, refractory low back and left leg pain. She presents today as a referral from Dr Deloria Lair for consideration of lumbar ESI.     Pain History:    Alexandra Graves states that this pain has been going on since last November.  However she states it got worse about a month back.  She went to the ER and saw Dr. [name] at that time and was found to have L3 compression fracture.  She has continued to trial PT and has found no improvement.  MRI was done which showed a subacute L3 compression fracture as well as lumbar stenosis.  She has not had pain management has not tried steroid injections.  She has 2 complaints of pain.  She has mechanical back pain.  But she also has pain rating down her left leg down her anterior thigh.  Her pain worsens with walking and improves with lying down.  She also complains of hip pain and has a previous hip fusion. No red flag symptoms.  No Blood thinners.    Current Medications[1]     Allergies[2]     Social History[3]    Radiological Findings:   XR AP & Lateral, Lumbar Spine     Result Date: 10/26/2022  There is a compression fracture of the L3 vertebral body with moderate vertebral body height loss.  The degree of height loss is mildly progressive compared to the prior plain radiographs and similar in degree compared to the prior MRI examination, allowing for differences in modality. Alexandra Dress, MD 10/26/2022 8:30 AM     MRI Lumbar Spine WO Contrast     Result Date: 10/06/2022  1.Subacute L3 superior endplate compression fracture with associated marrow edema. 2.Multilevel degenerative changes of the lumbar spine, as detailed above. Central canal stenosis is most pronounced at L2-L3. Foraminal narrowing is most pronounced on the left at L2-L3 and on the right at L5-S1.     Physical Exam     BP 129/79 (BP Site: Right arm, Patient Position: Sitting, Cuff Size: Medium)   Pulse 61   Temp 97.5 F (36.4 C) (Temporal)   Resp 12   Wt 47.3 kg (104 lb 3.2 oz)   SpO2 100%   BMI 20.35 kg/m     GENERAL: Well developed, well nourished, no acute distress.  Normal mood and affect.   ABDOMEN: Non-distended.   NEUROLOGIC: Sensory and Motor exam grossly intact; gait -- sl antalgic  SPINE/MUSCULOSKELETAL:   ROM lumbar spine: decreased   Spinal curvature: kyphoscoliosis   No palpable trigger points.   SKIN: No evidence of swelling, edema, rashes, or infection on exposed areas of skin.  PSYCHIATRIC: The patient is alert and oriented.  Affect is appropriate.  she does not appear sedated or somnolent on exam.     Description of Procedure:     The risk benefits and alternatives were reviewed and the patient agreed to proceed.  I had the patient either sign a written informed consent for serial procedures over the next six months or I confirmed there was a valid prior signed informed consent for serial procedures after confirming there were no further questions or concerns.  The patient was then taken to the procedure room and positioned prone.  Timeout was performed after positioning including identification of the patient, confirmation of procedure, review of allergies and asking patient whether they are taking blood thinning  medication.  The back was prepped with ChloraPrep and adequate time was allowed for the ChloraPrep to dry.  The back was then draped in the usual sterile fashion.  I wore a mask and sterile gloves for the procedure.  Using fluoroscopic guidance, I identified the left L4-5 and right L3-4 neural foramen and anesthetized the skin and underlying tissues above with 1.5 cc's 0.25% bupivacaine.  I then advanced a 3-1/2 inch 22-gauge Quincke spinal needle into the neural foramen.  There were no paresthesias needle placement.  Injection of 2 cc's Omnipaque outlined epidural spread.  There was no vascular runoff.  I then injected a solution containing a mixture of  1 cc 0.25% bupivacaine and 20 mg methylprednisolone.  I flushed the needle before removal, applied pressure to the needle insertion site with guaze and had a sterile bandage applied to the needle insertion site.  Patient tolerated the procedure well and was taken to the recovery area in stable condition.    Fluoroscopy Data:   Refer to imaging section in Epic    Medications used for the procedure: Refer to Epic and [employer] data    Plan:     Home Exercise Program  Activity Modifications  Continue Current Pain Medication Regimen  Follow up with Dr Deloria Lair in one week  Follow up with me in 1-2 months, earlier if needed    Glendell Docker, MD  [facility] Program Director for Pain Medicine  Assistant Professor, [employer] [facility]  Team Physician, Pain Medicine, [facility] Football Team  [facility]  [address]  [place], [place] [address]  T [phone]         This note was generated in part by the Epic EMR system/ Dragon speech recognition and may contain errors or omissions not intended by the user. Grammatical errors, random word insertions, deletions, pronoun errors and incomplete sentences are occasional consequences of this technology due to software limitations. Not all errors are caught or  corrected. If there are questions or concerns about the content of this note or information contained within the body of this dictation they should be addressed directly with the author for clarification         [1]   Current Outpatient Medications   Medication Sig Dispense Refill    acetaminophen (TYLENOL) 500 MG tablet Take 1 tablet (500 mg) by mouth      amLODIPine (NORVASC) 5 MG tablet       aspirin EC 81 MG EC tablet Take 1 tablet (81 mg) by mouth daily      Biotin 1000 MCG Chew Tab Chew by mouth      Bystolic 5 MG tablet       Calcium Citrate-Vitamin  D 315-5 MG-MCG Tab Take by mouth      Denosumab (PROLIA SC) Inject into the skin      losartan-hydrochlorothiazide (HYZAAR) 100-25 MG per tablet       omeprazole (PriLOSEC) 40 MG capsule       oxyCODONE (ROXICODONE) 5 MG immediate release tablet Take 0.5 tablets (2.5 mg) by mouth every 4 (four) hours as needed for Pain 10 tablet 0    propafenone (RYTHMOL) 150 MG tablet       saccharomyces boulardii (FLORASTOR) 250 MG capsule Take 1 capsule (250 mg) by mouth 2 (two) times daily      spironolactone (ALDACTONE) 25 MG tablet        Current Facility-Administered Medications   Medication Dose Route Frequency Provider Last Rate Last Admin    bupivacaine (PF) (MARCAINE) 0.25 % injection 2 mL  2 mL Epidural Once Ellarose Brandi, Gloriajean Dell, MD        iohexol (OMNIPAQUE) 240 MG/ML solution 1 mL  1 mL Other Once Davinia Riccardi, Gloriajean Dell, MD        methylPREDNISolone acetate (DEPO-MEDROL) injection 40 mg  40 mg Epidural Once Dorotea Hand, Gloriajean Dell, MD       [2] No Known Allergies  [3]   Social History  Tobacco Use    Smoking status: Never    Smokeless tobacco: Never   Vaping Use    Vaping status: Never Used   Substance Use Topics    Alcohol use: Yes     Comment: wine    Drug use: Never

## 2022-11-01 NOTE — Discharge Instr - AVS First Page (Signed)
Dr. Cathren Laine, PA-C    [facility]  [address]  [place], [place] [address]     Phone [phone] and Fax: [phone]    IPostoperative Instructions following Back Surgery    EXPECTATIONS OF RECOVERY :  After surgery, you can expect to feel slightly  weak and tired, but you should become stronger every day. It is normal to feel some discomfort around your incision, as well as in your (arms/legs). There may be some residual numbness and weakness after surgery. This may take months to go away and may not ever completely go away. Call us if the symptoms suddenly worsen after surgery. The pain will often increase right after surgery and then decrease over time.     ACTIVITY:  No bending at the waist, twisting or  lifting anything greater than about 10 pounds for the first 4-6 weeks after surgery.   It is very important to get up and moving at home, but it is equally important to not overdo activities. Plan to have some assistance at home for the first few days. You will be able to walk, take stairs (carefully and slowly), use the bathroom, and get up from sitting to standing using the arms of the chair as support.   You may feel pressure when having a bowel movement if you had lower back surgery, it is important to take a stool softener, especially while on  an opioid medication for the first weeks after surgery.  You can start walking the day you get home from surgery, and slowly increase your activity back to baseline. For example, increase from a short, less than 5 minute walk the first few days, to around the block and up to 15-20 minutes after a week. Do not bend, twist, or lean to pick things up or change positions. Remember good back mechanics and use your legs to sit down and stand up.    PHYSICAL THERAPY:  We will discuss PT at your first follow up visit. You will not begin therapy until after your first follow up appointment.    DRESSING  CARE:  You may remove the dressing TWO  days after surgery. If your surgery was on a Monday, then on Wednesday evening you can remove the dressing. If steri-strips are present,  leave the steri strips in place until they fall off themselves.  Steri-strips are little pieces of tape covering the wound. You will need someone to help you remove the dressing.    Keep your incision clean and dry at all times.     Do not apply any lotions, gels, ointments or other products to your incision unless directed.      SHOWERING:  You may shower on day THREE  after the surgery. If your surgery was Monday, you may shower on Thursday.  You may need assistance.   You do not need to clean the wound with anything more than soap and water.    You should not let the water hit the incision directly for long periods of time, nor should you soak the incision in a bathtub, hot tub, or pool.    When you exit the shower, carefully dry the wound with a towel. You may then swipe an alcohol pad over the area to further assure it is completely dry. If the steri-strips are still in place, then you may swipe the alcohol around the strips.    MEDICATION:  You will be prescribed pain  medications  and a stool softener. You may have pain after the surgery but should only  need to take opioid  pain medication for up to  2 weeks.  You should avoid driving or operating any vehicle after taking the medication. It can cause drowsiness as well as constipation. Continue taking the stool softener while on pain medicine to avoid any complications.     If you use all of your pain medication and need a refill, we will evaluate your situation and you may need a special referral to a pain doctor at that time.   You may take Tylenol (acetaminophen) for pain, but keep in mind there is acetaminophen in your many opioid pain medication, so do not take both.  You may resume your home medications as indicated on your discharge form.   You should NOT resume your blood  thinning medication such as Coumadin, heparin, aggrenox, plavix, or any aspirin containing products until instructed to do so by your surgeon.    DRIVING:  You may resume driving after 1 week as long as you are no longer taking opioid medications.     SMOKING:    Smoking delays the healing process; we request that you avoid smoking if possible.    EATING AND DRINKING:  You may resume a normal diet following your procedure.  Please avoid alcoholic beverages while taking pain medication.     SEXUAL ACTIVITY:  Sexual activity is not advised until you have followed up with [place] in the clinic. The jarring mechanism should be avoided for a short time post operatively. After 10 days-2 weeks, you may resume activity with your back/neck supported by the bed/surface.    WORK:  Do not return to work until you have been advised to do so by staff. Generally, we recommend taking   4 weeks off of work for recovery, but each individual situation will be reviewed independently. We do not recommend  You return to work until you have stopped taking the opioid pain medication.    HOUSEWORK:  Avoid vacuuming or laundry until you have followed up in the clinic. The bending and lifting motion can place pressure on your back and neck.    STITCHES or STAPLES:  You may or may not have stitches or staples.   We will inform you if you have them, for you will need to return to the office in 7-14 days for their removal.  You need to schedule a nurse visit at [phone] to have them removed.   If you notice redness, swelling or drainage around your incision or develop a fever > 101.5 F., please call the office immediately [phone] for instructions.     FOLLOW-UP APPOINTMENTS:  You will follow up 10-14 days after surgery for a wound check with a clinic nurse.  You will not see your surgical team at this time.  You will see Dr. Deloria Lair and / or Vanessa Kick, PA-C 4 to 6 weeks after surgery for a post-op visit.    PROBLEMS OR CONCERNS:  -  Please feel free to call the office at [phone] at any time for problems, concerns, or questions you may have or you may e-mail Vanessa Kick, PA-C at [email or web address] for non-emergent issues.   We are always available to help you or  answer any  questions. In an emergency, please cal 911 or go to the nearest Emergency Department.  - Our office is open Monday-Friday 0800-4:30 pm.  If you contact the office after 4:30 pm  you will be directed to the after-hours service, which will be a neurosurgery team member.        Discharge Instructions: After Your Surgery  You've just had surgery. During surgery, you were given medicine called anesthesia to keep you relaxed and free of pain. After surgery, you may have some pain or nausea. This is common. Here are some tips for feeling better and getting well after surgery.   Going home  Your healthcare provider will show you how to take care of yourself when you go home. They'll also answer your questions. Have an adult family member or friend drive you home. For the first 24 hours after your surgery:   Don't drive or use heavy equipment.  Don't make important decisions or sign legal papers.  Take medicines as directed.  Don't drink alcohol.  Have someone stay with you, if needed. They can watch for problems and help keep you safe.  Be sure to go to all follow-up visits with your healthcare provider. And rest after your surgery for as long as your provider tells you to.   Coping with pain  If you have pain after surgery, pain medicine will help you feel better. Take it as directed, before pain becomes severe. Also, ask your healthcare provider or pharmacist about other ways to control pain. This might be with heat, ice, or relaxation. And follow any other instructions your surgeon or nurse gives you.      Stay on schedule with your medicine.     Tips for taking pain medicine  To get the best relief possible, remember these points:   Pain medicines can upset your  stomach. Taking them with a little food may help.  Most pain relievers taken by mouth need at least 20 to 30 minutes to start to work.  Don't wait till your pain becomes severe before you take your medicine. Try to time your medicine so that you can take it before starting an activity. This might be before you get dressed, go for a walk, or sit down for dinner.  Constipation is a common side effect of some pain medicines. Call your healthcare provider before taking any medicines such as laxatives or stool softeners to help ease constipation. Also ask if you should skip any foods. Drinking lots of fluids and eating foods such as fruits and vegetables that are high in fiber can also help. Remember, don't take laxatives unless your surgeon has prescribed them.  Drinking alcohol and taking pain medicine can cause dizziness and slow your breathing. It can even be deadly. Don't drink alcohol while taking pain medicine.  Pain medicine can make you react more slowly to things. Don't drive or run machinery while taking pain medicine.  Your healthcare provider may tell you to take acetaminophen to help ease your pain. Ask them how much you're supposed to take each day. Acetaminophen or other pain relievers may interact with your prescription medicines or other over-the-counter (OTC) medicines. Some prescription medicines have acetaminophen and other ingredients in them. Using both prescription and OTC acetaminophen for pain can cause you to accidentally overdose. Read the labels on your OTC medicines with care. This will help you to clearly know the list of ingredients, how much to take, and any warnings. It may also help you not take too much acetaminophen. If you have questions or don't understand the information, ask your pharmacist or healthcare provider to explain it to you before you take the OTC medicine.   Managing nausea  Some people have an upset stomach (nausea) after surgery. This is often because of anesthesia,  pain, or pain medicine, less movement of food in the stomach, or the stress of surgery. These tips will help you handle nausea and eat healthy foods as you get better. If you were on a special food plan before surgery, ask your healthcare provider if you should follow it while you get better. Check with your provider on how your eating should progress. It may depend on the surgery you had. These general tips may help:   Don't push yourself to eat. Your body will tell you when to eat and how much.  Start off with clear liquids and soup. They're easier to digest.  Next try semi-solid foods as you feel ready. These include mashed potatoes, applesauce, and gelatin.  Slowly move to solid foods. Don't eat fatty, rich, or spicy foods at first.  Don't force yourself to have 3 large meals a day. Instead eat smaller amounts more often.  Take pain medicines with a small amount of solid food, such as crackers or toast. This helps prevent nausea.  When to call your healthcare provider  Call your healthcare provider right away if you have any of these:   You still have too much pain, or the pain gets worse, after taking the medicine. The medicine may not be strong enough. Or there may be a complication from the surgery.  You feel too sleepy, dizzy, or groggy. The medicine may be too strong.  Side effects such as nausea or vomiting. Your healthcare provider may advise taking other medicines to .  Skin changes such as rash, itching, or hives. This may mean you have an allergic reaction. Your provider may advise taking other medicines.  The incision looks different (for instance, part of it opens up).  Bleeding or fluid leaking from the incision site, and weren't told to expect that.  Fever of 100.54F (38C) or higher, or as directed by your provider.  Call 911  Call 911 right away if you have:   Trouble breathing  Facial swelling    If you have obstructive sleep apnea   You were given anesthesia medicine during surgery to keep you  comfortable and free of pain. After surgery, you may have more apnea spells because of this medicine and other medicines you were given. The spells may last longer than normal.    At home:  Keep using the continuous positive airway pressure (CPAP) device when you sleep. Unless your healthcare provider tells you not to, use it when you sleep, day or night. CPAP is a common device used to treat obstructive sleep apnea.  Talk with your provider before taking any pain medicine, muscle relaxants, or sedatives. Your provider will tell you about the possible dangers of taking these medicines.  Contact your provider if your sleeping changes a lot even when taking medicines as directed.  [employer] last reviewed this educational content on 11/07/2019   2000-2023 [employer], [place]. All rights reserved. This information is not intended as a substitute for professional medical care. Always follow your healthcare professional's instructions.

## 2022-11-01 NOTE — H&P (Signed)
[name]  Follow up Note     HPI           Chief Complaint   Patient presents with    Initial Consult       ED 09/05/22 [name]) - acute L4 comp fx w/ 40% LoH  MRI LS 10/03/22 - .Subacute L3 superior endplate compression fracture  XR LS 10/25/22         Alexandra Graves is a 89 y.o. female with PMH HTN, left hip replacement and breast cancer who is seen in the ED on 09/05/2022 with worsening left hip pain that radiates to the low back.  She states that this pain has been going on since last November.  However she states it got worse about a month back.  She went to the ER and saw Dr. [name] at that time and was found to have L3 compression fracture.  She has continued to trial PT and has found no improvement.  She recently saw [name] and MRI was done which showed a subacute L3 compression fracture as well as lumbar stenosis.  She has not had pain management has not tried steroid injections.  She has 2 complaints of pain.  She has mechanical back pain.  But she also has pain rating down her left leg down her anterior thigh.  Her pain worsens with walking and improves with lying down.  She also complains of hip pain and has a previous hip fusion.        Physical Examination   VITAL SIGNS:     Vitals       Vitals:     10/25/22 1114   BP: 121/72   Pulse: 61   Resp: 18              Neurologic Exam  Spine  No visible deformity  Full ROM  Non-tender to palpation     Mental Status    Alert   Speech clear and fluent     Cranial Nerves    CN III, IV, VI : Pupils are equal, round, and reactive to light.                         Extraocular motions are normal.   CN VII: Facial expression full, symmetric.   CN VIII: Hearing intact bilaterally  CN XI: Shoulder shrug symmetric     Motor Exam   Overall muscle tone: normal  Strength:              UE's:    Deltoid  C5 Bicep  C5-6 Tricep  C6-7 Wrist Ext  C6-7 Wrist   Flex  C7 Grip  C8 IO  C8-T1   Left 5 5 5 5 5 5 5    Right 5 5 5 5 5 5 5       LE's:     HF  L2-3 KE  L3-4 KF  L5-S1 DF  L4-5 PF  S1 EHL  L5   Left 5 5 5 5 5 5    Right 5 5 5 5 5 5       Sensation  Intact to light touch in upper and lower extremities     Reflexes    Left Right   Biceps             2+ 2+   Brachioradialis 2+ 2+   Patella 2+ 2+   Achilles 2+ 2+      Gait  Ambulates with  limp and hunched over     Review of Systems   Review of Systems  Constitutional: negative for fever or chills.  HENT: Negative for tinnitus or rhinorrhea   Eyes: Negative for visual disturbance.   Musculoskeletal: Negative for gait problem. Negative for neck pain. Negative for back pain.   Skin: Negative for wounds.  Neurological: no history of seizures.  Respiratory: Negative for cough or wheezing  Endocrine: Negative for cold or heat intolerance   GI: Negative for constipation or diarrhea      Radiology Interpretation   XR AP & Lateral, Lumbar Spine     Result Date: 10/26/2022  There is a compression fracture of the L3 vertebral body with moderate vertebral body height loss. The degree of height loss is mildly progressive compared to the prior plain radiographs and similar in degree compared to the prior MRI examination, allowing for differences in modality. [name], MD 10/26/2022 8:30 AM     MRI Lumbar Spine WO Contrast     Result Date: 10/06/2022  1.Subacute L3 superior endplate compression fracture with associated marrow edema. 2.Multilevel degenerative changes of the lumbar spine, as detailed above. Central canal stenosis is most pronounced at L2-L3. Foraminal narrowing is most pronounced on the left at L2-L3 and on the right at L5-S1. [name], MD 10/06/2022 6:18 AM       The images above were personally reviewed and discussed in detail with the patient.         Impression   Alexandra Graves is a 89 y.o. female PMH HTN, left hip replacement and breast cancer, prior cardiac surgery on ASA who presents with mechanical back pain and left L3 radiculopathy.  MRI demonstrates a subacute L3 compression fracture as well  as moderate central canal stenosis at L2-3 as well as multilevel facet arthropathy.       Plan   The options of management were discussed and the patient and family expressed good understanding.  Given that she continues to have low back pain in the setting of L3 fracture we will plan for an L3 kyphoplasty.  Will also refer her to pain management for ESI as she has failed physical therapy.        Referral for pain management for ESI.  Refill for 2.5 mg oxycodone  Plan for L3 kyphoplasty   - Potential benefits and outcomes of the procedure include improvement in the current condition, decrease pain, prevent further neurological deficits, and prevent progression of vertebral compression.  Potential risks and complications associated with the procedure include bleeding, infection, damage to surrounding structures including nerves, permanent or temporary neurological deficits, paralysis, risk of allergic reaction to bone cement, risk of bone cement leakage, paralysis, incomplete relief of symptoms, need for further surgery, and risk of anesthesia including coma and death.  Alternatives to the proposed procedure are no surgery, traditional open surgery, and medical management.        Follow-up   Follow-up 4 weeks after L3 kyphoplasty and ESI      Pre-Anesthesia Evaluation      Pre-op Interview visit requested by:   Reason for pre-op interview visit: Patient anticipating L3 KYPHOPLASTY procedure.        History of Present Illness/Summary:  Patient presents to the [facility] clinic for a pre-operative evaluation.     Assessment/Plan:     1.  Encounter for pre-operative examination [Z01.818]         Orders Placed This Encounter   Procedures    Culture Presurgical  Surveillance Methicillin Susceptible Staphylococcus aureus and Methicillin Resistant Staphylococcus aureus (MSSA + MRSA)    Culture Presurgical Surveillance Methicillin Susceptible Staphylococcus aureus and Methicillin Resistant Staphylococcus aureus (MSSA + MRSA)    Type  and Screen    Basic Metabolic Panel    Pre-Procedure Anemia Evaluation (Order)    CBC without Differential    Gold SST Hold Tube    Blood Type Confirmation    Presurgical Form, Reflex    Pre-Procedure Anemia Evaluation, Reflex Panel    Antibody Identification, Reflex      MRSA/MSSA negative.      2.  Surgical Diagnosis:  Lumbar radiculopathy [M54.16]     3.  Hypertension  Controlled with current regimen  Will update renal function and electrolytes today      4.  CAD s/p CABG  She is followed by cardiology - Dr. Carolin Guernsey (in [place])  She is on ASA 81 mg. Epic message sent to surgical team to notify that she has not stopped it yet.   She is able to achieve >4 METS without any cardiopulmonary symptoms. No further cardiac testing warranted at this time.       5. CKD Stage 3b  6. Hyperparathyroidism   She is followed by nephrology - Dr. Roland Rack  Cr 1.14 and eGFR 46 04/2022   Cr 1.0 and eGFR 53 today, calcium 10.7.      7. GERD  Controlled with current regimen     8. Anemia  H/H today is 11/33.7. PPAE panel pending.      9. History of blood transfusion  T&S checked today and she is antibody positive.   IgG antibody of undetermined specificity present in plasma.              All clinically significant antibodies excluded.            Patient is at elevated risk for perioperative CV complications 2/2 CAD, however, not prohibitive to proceeding with planned surgery or procedure.     Patient voiced understanding of all instructions.  All questions and concerns addressed at this time. This assessment will be conveyed to the surgery and anesthesiology teams & the patient will be evaluated the morning of surgery.  Education was provided to patient regarding CHG instructions, incentive spirometer use, preparing for surgery tips and pain management instructions.  The patient was given a Pre-Surgery packet containing Hibiclens solution and an incentive spirometer. Patient verbalizes understanding of ERAS education and  instructions.           Problem List:  Medical Problems         Hospital Problem List  Date Reviewed: 10/30/2022   None           Non-Hospital Problem List  Date Reviewed: 10/30/2022              ICD-10-CM Priority Class Noted Diagnosed     Left hip pain M25.552     10/09/2018       Closed compression fracture of L3 lumbar vertebra with delayed healing, subsequent encounter S32.030G     09/07/2022       Lumbar radiculopathy M54.16     10/25/2022       Essential hypertension I10     04/23/2019       Gastroesophageal reflux disease K21.9     04/23/2019       Hyperparathyroidism due to renal insufficiency N25.81     11/01/2021       Stage 3 chronic kidney disease  N18.30     04/23/2019       Coronary artery disease involving native coronary artery of native heart without angina pectoris I25.10     10/30/2022       S/P CABG (coronary artery bypass graft) Z95.1     10/30/2022       History of blood transfusion Z92.89     10/30/2022       Macrocytic anemia D53.9     10/30/2022       Red blood cell antibody positive R76.8     10/31/2022       Overview Signed 10/31/2022 10:47 AM by Barth Kirks, FNP       IgG antibody of undetermined specificity present in plasma.                       All clinically significant antibodies excluded.                           Medical History   Diagnosis Date    Coronary artery disease involving native coronary artery of native heart without angina pectoris 10/30/2022    Hypertension      Macrocytic anemia 10/30/2022    Osteoporosis        [Past Surgical History]    [Past Surgical History]        Procedure Laterality Date    EXPLORATORY LAPAROTOMY   2020    open heart surgery         Patient says about 6-7 years ago     TOTAL HIP ARTHROPLASTY   2018       Medication List               Accurate as of October 30, 2022 11:59 PM. Always use your most recent med list.                    acetaminophen 500 MG tablet  Take 1 tablet (500 mg) by mouth  Commonly known as: TYLENOL  Medication Adjustments for  Surgery: Take as needed      amLODIPine 5 MG tablet  Commonly known as: NORVASC  Medication Adjustments for Surgery: Take as prescribed      aspirin EC 81 MG EC tablet  Take 1 tablet (81 mg) by mouth daily  Medication Adjustments for Surgery: Stop now      Biotin 1000 MCG Chew  Chew by mouth  Medication Adjustments for Surgery: Hold day of surgery      Bystolic 5 MG tablet  Generic drug: nebivolol  Medication Adjustments for Surgery: Take as prescribed      Calcium Citrate-Vitamin D 315-5 MG-MCG Tabs  Take by mouth  Medication Adjustments for Surgery: Hold day of surgery      losartan-hydrochlorothiazide 100-25 MG per tablet  Commonly known as: HYZAAR  Medication Adjustments for Surgery: Hold day of surgery      omeprazole 40 MG capsule  Commonly known as: PriLOSEC  Medication Adjustments for Surgery: Take as prescribed      oxyCODONE 5 MG immediate release tablet  Take 0.5 tablets (2.5 mg) by mouth every 4 (four) hours as needed for Pain  Commonly known as: ROXICODONE  Medication Adjustments for Surgery: Take as needed      PROLIA SC  Inject into the skin  Medication Adjustments for Surgery: Hold day of surgery      propafenone 150 MG tablet  Commonly known as:  RYTHMOL  Medication Adjustments for Surgery: Take as prescribed      saccharomyces boulardii 250 MG capsule  Take 1 capsule (250 mg) by mouth 2 (two) times daily  Commonly known as: FLORASTOR  Medication Adjustments for Surgery: Hold day of surgery      spironolactone 25 MG tablet  Commonly known as: ALDACTONE  Medication Adjustments for Surgery: Take as prescribed                [Allergies]    [Allergies]  No Known Allergies     Social History            Occupational History    Not on file   Tobacco Use    Smoking status: Never    Smokeless tobacco: Never   Vaping Use    Vaping status: Never Used   Substance and Sexual Activity    Alcohol use: Yes       Comment: wine    Drug use: Never    Sexual activity: Not on file         Menstrual History:   LMP /  Status  Postmenopausal     No LMP recorded. Patient is postmenopausal.    Tubal Ligation?  No valid surgical or medical questions entered.               Exam Scores:   SDB score  OSA Risk Category: No Risk        STBUR score       PONV score  Nausea Risk: SEVERE RISK    MST score  MST Score: 0    PEN-FAST score       Frailty score  CFS Score: 3    MICA       RCRI score  RCRI Score: 1    DASI  DASI Score: 15.45  METs Level: 4.64    EA-DIVA  EA-DIVA Score: 2    CHADsVasc               Visit Vitals  BP 111/68   Pulse 67   Ht 1.524 m (5')   Wt 46.7 kg (103 lb)   SpO2 97%   BMI 20.12 kg/m      Review of Systems   Constitutional: Negative.    HENT: Negative.     Eyes: Negative.    Respiratory: Negative.     Cardiovascular: Negative.    Gastrointestinal: Negative.    Genitourinary: Negative.    Musculoskeletal:  Positive for back pain.   Skin: Negative.    Neurological: Negative.    Endo/Heme/Allergies: Negative.    Psychiatric/Behavioral: Negative.           Physical Exam:  Mallampati: II  TM distance: 3 FB (4-6 cm)  Mouth opening: < 3 FB (< 4 cm)  Neck extension: full  Upper lip bite assessment: class I  (-) enlarged neck circumference     Dental comments: Posterior crowns  Nothing loose      Mental status: alert and oriented x3  Speech: normal     Heart rhythm: regular  no murmur:     Peripheral edema 1+  (-) a murmur     Breath sounds clear to auscultation bilaterally                     Recent Labs   CBC (last 180 days) 10/30/22  0926   WBC 5.39   RBC 3.32*   Hemoglobin 11.0*   Hematocrit 33.7*  MCV 101.5*   MCH 33.1   MCHC 32.6   RDW 13   Platelet Count 317   MPV 9.7   nRBC % 0.0   Absolute nRBC 0.00          Recent Labs   BMP (last 180 days) 10/30/22  0926   Glucose 91   BUN 20   Creatinine 1.0  1.0   Sodium 135   Potassium 3.9   Chloride 103   CO2 20   Calcium 10.7*   Anion Gap 12.0   GFR 53.2*  53.2*              Recent Labs   Other (last 180 days) 10/30/22  0926   TSH 1.01   Iron 59   Ferritin 36.20   Iron  Saturation 19   Culture Staph and MRSA Surveillance No Methicillin Resistant Staphylococcus aureus and No Staphylococcus aureus isolated  No Methicillin Resistant Staphylococcus aureus and No Staphylococcus aureus isolated          Recent Labs   Type & Screen (last 34 days) 10/30/22  0926   ABO Rh A Neg   Antibody Screen Positive          Anesthesia Plan:  ASA 3   Anesthetic Options Discussed: General  Potential anesthesia/Perioperative problems: No Problems Identified     DNR status: Patient or surrogate requests that DNR status remain full code.  Discussed DNR status with: Patient        A discussion with regarding next steps to prepare for the procedure and the planned anesthesia care took place during today's visit.  I explained that the patient will meet with their anesthesiology providers on the DOS.  Discussed with Patient        Acceptability of blood products: Accepted  Use of blood products discussed with Patient

## 2022-11-02 ENCOUNTER — Ambulatory Visit
Admission: RE | Admit: 2022-11-02 | Discharge: 2022-11-02 | Disposition: A | Discharge status: Home / Self Care | Payer: Medicare Other | Source: Ambulatory Visit | Attending: Neurological Surgery | Admitting: Neurological Surgery

## 2022-11-02 ENCOUNTER — Ambulatory Visit: Payer: Medicare Other | Admitting: Certified Registered"

## 2022-11-02 ENCOUNTER — Encounter
Admission: RE | Disposition: A | Discharge status: Home / Self Care | Payer: Self-pay | Source: Ambulatory Visit | Attending: Neurological Surgery

## 2022-11-02 ENCOUNTER — Ambulatory Visit: Payer: Medicare Other

## 2022-11-02 DIAGNOSIS — D539 Nutritional anemia, unspecified: Secondary | ICD-10-CM | POA: Insufficient documentation

## 2022-11-02 DIAGNOSIS — Z853 Personal history of malignant neoplasm of breast: Secondary | ICD-10-CM | POA: Insufficient documentation

## 2022-11-02 DIAGNOSIS — S32030A Wedge compression fracture of third lumbar vertebra, initial encounter for closed fracture: Secondary | ICD-10-CM | POA: Diagnosis present

## 2022-11-02 DIAGNOSIS — E213 Hyperparathyroidism, unspecified: Secondary | ICD-10-CM | POA: Insufficient documentation

## 2022-11-02 DIAGNOSIS — M5416 Radiculopathy, lumbar region: Secondary | ICD-10-CM | POA: Insufficient documentation

## 2022-11-02 DIAGNOSIS — M8008XA Age-related osteoporosis with current pathological fracture, vertebra(e), initial encounter for fracture: Secondary | ICD-10-CM | POA: Insufficient documentation

## 2022-11-02 DIAGNOSIS — I2510 Atherosclerotic heart disease of native coronary artery without angina pectoris: Secondary | ICD-10-CM | POA: Insufficient documentation

## 2022-11-02 DIAGNOSIS — Z96642 Presence of left artificial hip joint: Secondary | ICD-10-CM | POA: Insufficient documentation

## 2022-11-02 DIAGNOSIS — K219 Gastro-esophageal reflux disease without esophagitis: Secondary | ICD-10-CM | POA: Insufficient documentation

## 2022-11-02 DIAGNOSIS — Z951 Presence of aortocoronary bypass graft: Secondary | ICD-10-CM | POA: Insufficient documentation

## 2022-11-02 DIAGNOSIS — M48061 Spinal stenosis, lumbar region without neurogenic claudication: Secondary | ICD-10-CM | POA: Insufficient documentation

## 2022-11-02 DIAGNOSIS — M4726 Other spondylosis with radiculopathy, lumbar region: Secondary | ICD-10-CM | POA: Insufficient documentation

## 2022-11-02 DIAGNOSIS — I129 Hypertensive chronic kidney disease with stage 1 through stage 4 chronic kidney disease, or unspecified chronic kidney disease: Secondary | ICD-10-CM | POA: Insufficient documentation

## 2022-11-02 DIAGNOSIS — N1832 Chronic kidney disease, stage 3b: Secondary | ICD-10-CM | POA: Insufficient documentation

## 2022-11-02 HISTORY — PX: KYPHOPLASTY, LUMBAR: SHX4434

## 2022-11-02 SURGERY — KYPHOPLASTY, LUMBAR
Anesthesia: Anesthesia General | Site: Spine Lumbar | Wound class: Clean

## 2022-11-02 MED ORDER — FENTANYL CITRATE (PF) 50 MCG/ML IJ SOLN (WRAP)
25.0000 ug | INTRAMUSCULAR | Status: DC | PRN
Start: 2022-11-02 — End: 2022-11-02

## 2022-11-02 MED ORDER — KETOROLAC TROMETHAMINE 30 MG/ML IJ SOLN
INTRAMUSCULAR | Status: DC | PRN
Start: 2022-11-02 — End: 2022-11-02
  Administered 2022-11-02: 15 mg via INTRAVENOUS

## 2022-11-02 MED ORDER — SUCCINYLCHOLINE CHLORIDE 20 MG/ML IJ/IV SOLN (WRAP)
Status: AC
Start: 2022-11-02 — End: ?
  Filled 2022-11-02: qty 5

## 2022-11-02 MED ORDER — STERILE WATER FOR INJECTION IJ/IV SOLN (WRAP)
2.0000 g | INTRAMUSCULAR | Status: AC
Start: 2022-11-02 — End: 2022-11-02
  Administered 2022-11-02: 2 g via INTRAVENOUS

## 2022-11-02 MED ORDER — LABETALOL HCL 5 MG/ML IV SOLN (WRAP)
INTRAVENOUS | Status: AC
Start: 2022-11-02 — End: ?
  Filled 2022-11-02: qty 4

## 2022-11-02 MED ORDER — LIDOCAINE IN D5W 4-5 MG/ML-% IV SOLN
INTRAVENOUS | Status: AC
Start: 2022-11-02 — End: ?
  Filled 2022-11-02: qty 500

## 2022-11-02 MED ORDER — ONDANSETRON HCL 4 MG/2ML IJ SOLN
INTRAMUSCULAR | Status: AC
Start: 2022-11-02 — End: ?
  Filled 2022-11-02: qty 2

## 2022-11-02 MED ORDER — LIDOCAINE-EPINEPHRINE 1 %-1:100000 IJ SOLN
INTRAMUSCULAR | Status: DC | PRN
Start: 2022-11-02 — End: 2022-11-02
  Administered 2022-11-02: 10 mL

## 2022-11-02 MED ORDER — FAMOTIDINE 10 MG/ML IV SOLN (WRAP)
INTRAVENOUS | Status: AC
Start: 2022-11-02 — End: ?
  Filled 2022-11-02: qty 6

## 2022-11-02 MED ORDER — OXYCODONE HCL 5 MG PO TABS
5.0000 mg | ORAL_TABLET | Freq: Once | ORAL | Status: DC | PRN
Start: 2022-11-02 — End: 2022-11-02

## 2022-11-02 MED ORDER — PROPOFOL 10 MG/ML IV EMUL (WRAP)
INTRAVENOUS | Status: DC | PRN
Start: 2022-11-02 — End: 2022-11-02
  Administered 2022-11-02 (×3): 50 mg via INTRAVENOUS
  Administered 2022-11-02: 30 mg via INTRAVENOUS

## 2022-11-02 MED ORDER — LUBRIFRESH P.M. OP OINT
TOPICAL_OINTMENT | OPHTHALMIC | Status: AC
Start: 2022-11-02 — End: ?
  Filled 2022-11-02: qty 3.5

## 2022-11-02 MED ORDER — PHENYLEPHRINE HCL-NACL 100-0.9 MG/250ML-% IV SOLN
INTRAVENOUS | Status: AC
Start: 2022-11-02 — End: ?
  Filled 2022-11-02: qty 250

## 2022-11-02 MED ORDER — ACETAMINOPHEN 10 MG/ML IV SOLN
INTRAVENOUS | Status: AC
Start: 2022-11-02 — End: ?
  Filled 2022-11-02: qty 200

## 2022-11-02 MED ORDER — OXYCODONE HCL 5 MG PO TABS
2.5000 mg | ORAL_TABLET | ORAL | 0 refills | Status: AC | PRN
Start: 2022-11-02 — End: 2022-11-09

## 2022-11-02 MED ORDER — LABETALOL HCL 5 MG/ML IV SOLN (WRAP)
20.0000 mg | INTRAVENOUS | Status: DC | PRN
Start: 2022-11-02 — End: 2022-11-02

## 2022-11-02 MED ORDER — LIDOCAINE HCL 1 % IJ SOLN
1.0000 mL | Freq: Once | INTRAMUSCULAR | Status: DC | PRN
Start: 2022-11-02 — End: 2022-11-02

## 2022-11-02 MED ORDER — LIDOCAINE 4 % EX CREA
TOPICAL_CREAM | Freq: Once | CUTANEOUS | Status: DC | PRN
Start: 2022-11-02 — End: 2022-11-02

## 2022-11-02 MED ORDER — GABAPENTIN 100 MG PO CAPS
100.0000 mg | ORAL_CAPSULE | Freq: Once | ORAL | Status: AC
Start: 2022-11-02 — End: 2022-11-02
  Administered 2022-11-02: 100 mg via ORAL

## 2022-11-02 MED ORDER — SUCCINYLCHOLINE CHLORIDE 20 MG/ML IJ SOLN
INTRAMUSCULAR | Status: DC | PRN
Start: 2022-11-02 — End: 2022-11-02
  Administered 2022-11-02: 40 mg via INTRAVENOUS

## 2022-11-02 MED ORDER — LACTATED RINGERS IV SOLN
INTRAVENOUS | Status: DC | PRN
Start: 2022-11-02 — End: 2022-11-02

## 2022-11-02 MED ORDER — SODIUM CHLORIDE 0.9% BAG (IRRIGATION USE)
INTRAVENOUS | Status: DC | PRN
Start: 2022-11-02 — End: 2022-11-02
  Administered 2022-11-02: 1000 mL

## 2022-11-02 MED ORDER — LACTATED RINGERS IV SOLN
INTRAVENOUS | Status: DC
Start: 2022-11-02 — End: 2022-11-02

## 2022-11-02 MED ORDER — GABAPENTIN 100 MG PO CAPS
ORAL_CAPSULE | ORAL | Status: AC
Start: 2022-11-02 — End: ?
  Filled 2022-11-02: qty 1

## 2022-11-02 MED ORDER — PHENYLEPHRINE 100 MCG/ML IV SOSY (WRAP)
PREFILLED_SYRINGE | INTRAVENOUS | Status: AC
Start: 2022-11-02 — End: ?
  Filled 2022-11-02: qty 10

## 2022-11-02 MED ORDER — CEFAZOLIN SODIUM 2 G IJ SOLR
INTRAMUSCULAR | Status: AC
Start: 2022-11-02 — End: ?
  Filled 2022-11-02: qty 2000

## 2022-11-02 MED ORDER — ACETAMINOPHEN 500 MG PO TABS
ORAL_TABLET | ORAL | Status: AC
Start: 2022-11-02 — End: ?
  Filled 2022-11-02: qty 2

## 2022-11-02 MED ORDER — ONDANSETRON HCL 4 MG/2ML IJ SOLN
4.0000 mg | Freq: Once | INTRAMUSCULAR | Status: DC | PRN
Start: 2022-11-02 — End: 2022-11-02

## 2022-11-02 MED ORDER — PROPOFOL 10 MG/ML IV EMUL (WRAP)
INTRAVENOUS | Status: AC
Start: 2022-11-02 — End: ?
  Filled 2022-11-02: qty 20

## 2022-11-02 MED ORDER — ALBUMIN HUMAN/BIOSIMILIAR 5% IV SOLN (WRAP)
INTRAVENOUS | Status: AC
Start: 2022-11-02 — End: ?
  Filled 2022-11-02: qty 1000

## 2022-11-02 MED ORDER — METHOCARBAMOL 500 MG PO TABS
750.0000 mg | ORAL_TABLET | Freq: Once | ORAL | Status: DC
Start: 2022-11-02 — End: 2022-11-02

## 2022-11-02 MED ORDER — ACETAMINOPHEN 10 MG/ML IV SOLN
INTRAVENOUS | Status: DC | PRN
Start: 2022-11-02 — End: 2022-11-02
  Administered 2022-11-02: 500 mg via INTRAVENOUS

## 2022-11-02 MED ORDER — METOCLOPRAMIDE HCL 5 MG/ML IJ SOLN
10.0000 mg | Freq: Once | INTRAMUSCULAR | Status: DC | PRN
Start: 2022-11-02 — End: 2022-11-02

## 2022-11-02 MED ORDER — DEXAMETHASONE SODIUM PHOSPHATE 20 MG/5ML IJ SOLN
INTRAMUSCULAR | Status: AC
Start: 2022-11-02 — End: ?
  Filled 2022-11-02: qty 5

## 2022-11-02 MED ORDER — ONDANSETRON HCL 4 MG/2ML IJ SOLN
INTRAMUSCULAR | Status: DC | PRN
Start: 2022-11-02 — End: 2022-11-02
  Administered 2022-11-02: 4 mg via INTRAVENOUS

## 2022-11-02 MED ORDER — CEPHALEXIN 250 MG PO CAPS
250.0000 mg | ORAL_CAPSULE | Freq: Four times a day (QID) | ORAL | 0 refills | Status: AC
Start: 2022-11-02 — End: 2022-11-05

## 2022-11-02 MED ORDER — DEXAMETHASONE SOD PHOSPHATE PF 10 MG/ML IJ SOLN
INTRAMUSCULAR | Status: DC | PRN
Start: 2022-11-02 — End: 2022-11-02
  Administered 2022-11-02: 4 mg via INTRAVENOUS

## 2022-11-02 MED ORDER — KETOROLAC TROMETHAMINE 60 MG/2ML IM SOLN
INTRAMUSCULAR | Status: AC
Start: 2022-11-02 — End: ?
  Filled 2022-11-02: qty 2

## 2022-11-02 MED ORDER — LIDOCAINE-EPINEPHRINE 1 %-1:100000 IJ SOLN
INTRAMUSCULAR | Status: AC
Start: 2022-11-02 — End: ?
  Filled 2022-11-02: qty 20

## 2022-11-02 MED ORDER — HYDROMORPHONE HCL 1 MG/ML IJ SOLN
0.5000 mg | INTRAMUSCULAR | Status: DC | PRN
Start: 2022-11-02 — End: 2022-11-02

## 2022-11-02 MED ORDER — ACETAMINOPHEN 500 MG PO TABS
1000.0000 mg | ORAL_TABLET | Freq: Once | ORAL | Status: AC
Start: 2022-11-02 — End: 2022-11-02
  Administered 2022-11-02: 1000 mg via ORAL

## 2022-11-02 MED ORDER — GABAPENTIN 300 MG PO CAPS
300.0000 mg | ORAL_CAPSULE | Freq: Once | ORAL | Status: DC
Start: 2022-11-02 — End: 2022-11-02

## 2022-11-02 MED ORDER — MAGNESIUM SULFATE IN D5W 1-5 GM/100ML-% IV SOLN
INTRAVENOUS | Status: AC
Start: 2022-11-02 — End: ?
  Filled 2022-11-02: qty 200

## 2022-11-02 MED ORDER — LABETALOL HCL 5 MG/ML IV SOLN (WRAP)
INTRAVENOUS | Status: DC | PRN
Start: 2022-11-02 — End: 2022-11-02
  Administered 2022-11-02: 10 mg via INTRAVENOUS

## 2022-11-02 SURGICAL SUPPLY — 53 items
ADHESIVE SKIN CLOSURE DERMABOND ADVANCED (Skin Closure) ×1
ADHESIVE SKIN CLOSURE DERMABOND ADVANCED .7 ML LIQUID APPLICATOR (Skin Closure) ×1 IMPLANT
ADHESIVE SKIN CLOSURE DERMABOND PRINEO (Adhesion Barriers) ×1
ADHESIVE SKIN CLOSURE DERMABOND PRINEO 3.8 ML LIQUID MESH APPLICATOR (Adhesion Barriers) ×1 IMPLANT
BAG EQUIPMENT L28 IN X W36 IN BAND (Other) ×1 IMPLANT
BLADE 15 CLASSIC CARBON STEEL TISSUE (Blade) ×1
BLADE 15 CLASSIC CARBON STEEL TISSUE SURGICAL (Blade) ×1 IMPLANT
CEMENT BONE MIXER GRADUATE MARK DISPENSE (Cement) ×1 IMPLANT
CEMENT BONE MIXER GRADUATE MARK DISPENSE PLUNGER LUER FIT HV-R KYPHX (Cement) ×1 IMPLANT
COUNTER NDL 20 CNT 2 MAGNET DEVON STRL LF 1200 DSPSBL BLCK PLSTC FOAM (Procedure Accessories) ×1 IMPLANT
COUNTER NDL PLS FM DVN LF STRL 20 CNT 2 (Procedure Accessories) ×1
COVER BAND RIGID STERILE LIGHT SHIELDS LIGHT HANDLE PURPLE (Procedure Accessories) ×2 IMPLANT
COVER LGHT HNDL LGHT SHLD STRL BAND RGD (Procedure Accessories) ×2
DRAPE 74X41IN UNIVERSAL POLY XRAY C ARM CLOSURE STRAP (Drape) ×3 IMPLANT
DRAPE EQP VLCR POLY UNV STRDRP 74X41IN (Drape) ×3
DRAPE SRG PE STRDRP 17X11IN LF STRL ADH (Drape) ×6
DRAPE SURGICAL 2 INCISE FILM (Drape) ×1 IMPLANT
DRAPE SURGICAL 2 INCISE FILM POUCH (Drape) ×1
DRAPE SURGICAL 2 INCISE FILM POUCH ISOLATION ADHESIVE STRIP L125 IN X (Drape) ×1 IMPLANT
DRAPE SURGICAL ADHESIVE STRIP SMALL TOWEL MATTE FINISH L17 IN X W11 IN (Drape) ×6 IMPLANT
DRAPE SURGICAL SHEET FANFOLD L98 IN X (Drape) ×1
DRAPE SURGICAL SHEET FANFOLD L98 IN X W77 IN TIBURON XL PINK (Drape) ×1 IMPLANT
DRESSING TRANSPARENT L2 3/4 IN X W2 3/8 (Dressing) ×4
DRESSING TRANSPARENT L2 3/4 IN X W2 3/8 IN POLYURETHANE ADHESIVE (Dressing) ×4 IMPLANT
DRESSING TRANSPARENT L4 3/4 IN X W4 IN (Dressing) ×1
DRESSING TRANSPARENT L4 3/4 IN X W4 IN POLYURETHANE ADHESIVE (Dressing) ×1 IMPLANT
ELECTRODE ADULT PATIENT RETURN L9 FT REM POLYHESIVE ACRYLIC FOAM (Procedure Accessories) ×1 IMPLANT
ELECTRODE PATIENT RETURN L9 FT VALLEYLAB (Procedure Accessories) ×1
GAUZE PADS 4 X 4 (Dressing) ×1
GLOVE SURGICAL 9 BIOGEL PI INDICATOR (Glove) ×1
GLOVE SURGICAL 9 BIOGEL PI INDICATOR UNDERGLOVE POWDER FREE SMOOTH (Glove) ×1 IMPLANT
GLOVE SURGICAL 9 BIOGEL PI MICRO POWDER (Glove) ×1
GLOVE SURGICAL 9 BIOGEL PI MICRO POWDER FREE ANTISLIP MICRO ROUGHEN (Glove) ×1 IMPLANT
GOWN SURGICAL 2XL SMARTGOWN LEVEL 4 (Gown) ×1
GOWN SURGICAL 2XL SMARTGOWN LEVEL 4 BREATHABLE (Gown) ×1 IMPLANT
NEEDLE SPINAL BD OD18 GA L3 1/2 IN (Needles) ×1
NEEDLE SPINAL L3 1/2 IN REGULAR WALL QUINCKE TIP OD18 GA BD (Needles) ×1 IMPLANT
PEN SURGICAL MARKING MEDLINE SKIN RULER (Positioning Supplies) ×1
PEN SURGICAL MARKING SKIN RULER LARGE RESERVOIR REGULAR TIP LABEL (Positioning Supplies) ×1 IMPLANT
SOLUTION IRRIGATION 0.9% SODIUM CHLORIDE (Irrigation Solutions) ×1
SOLUTION IRRIGATION 0.9% SODIUM CHLORIDE 1000 ML PLASTIC POUR BOTTLE (Irrigation Solutions) ×1 IMPLANT
SOLUTION SURGICAL PREP 26 ML DURAPREP (Prep) ×4
SOLUTION SURGICAL PREP 26 ML DURAPREP 74% ISOPROPYL ALCOHOL 0.7% (Prep) ×4 IMPLANT
SPONGE GAUZE L2 IN X W2 IN 4 PLY HIGH (Dressing) ×4
SPONGE GAUZE L2 IN X W2 IN 4 PLY HIGH ABSORBENT NONWOVEN LINT FREE (Dressing) ×4 IMPLANT
SPONGE GAUZE L4 IN X W4 IN 12 PLY ABSORBABLE CUT EDGE LOFT FISHERBRAND (Dressing) ×1 IMPLANT
SUTURE MONOCRYL 4-0 P-3 L18 IN (Suture) ×1
SUTURE MONOCRYL 4-0 P-3 L18 IN MONOFILAMENT UNDYED ABSORBABLE (Suture) ×1 IMPLANT
SYRINGE 10 ML GRADUATE NONPYROGENIC DEHP (Syringes, Needles) ×1
SYRINGE 10 ML GRADUATE NONPYROGENIC DEHP FREE PVC FREE LOK MEDICAL (Syringes, Needles) ×1 IMPLANT
SYRINGE 20 ML BD LUER-LOK MEDICAL (Syringes, Needles) ×1 IMPLANT
SYRINGE MED 20ML LL LF STRL (Syringes, Needles) ×1
TRAY SURGICAL MINI OR (Pack) ×1 IMPLANT

## 2022-11-02 NOTE — Anesthesia Postprocedure Evaluation (Signed)
Anesthesia Post Evaluation    Patient: Alexandra Graves    Procedure(s):  L3 KYPHOPLASTY    Anesthesia type: general    Last Vitals:   Vitals Value Taken Time   BP 162/77 11/02/22 1440   Temp 36.2 C (97.1 F) 11/02/22 1413   Pulse 60 11/02/22 1440   Resp 20 11/02/22 1440   SpO2 96 % 11/02/22 1440                 Anesthesia Post Evaluation:     Patient Evaluated: PACU    Level of Consciousness: awake    Pain Management: adequate  Multimodal analgesia pain management approach  Strategies: acetaminophen and local anesthesia  Airway Patency: patent  Two or more mitigation strategies used for obstructive sleep apnea.  Strategies: multimodal analgesia and intraop administration of CPAP/nasophargyneal airway/oral appliance    Anesthetic complications: No      PONV Status: none    Cardiovascular status: acceptable  Respiratory status: acceptable  Hydration status: acceptable          Signed by: Renaldo Fiddler, MD, 11/02/2022 2:52 PM

## 2022-11-02 NOTE — Transfer of Care (Signed)
Anesthesia Transfer of Care Note    Patient: Alexandra Graves    Procedures performed: Procedure(s):  L3 KYPHOPLASTY    Anesthesia type: General ETT    Patient location:Phase I PACU    Last vitals:   Vitals:    11/02/22 1413   BP: 141/77   Pulse: (!) 58   Resp: 21   Temp: 36.2 C (97.1 F)   SpO2: 95%       Post pain: Patient not complaining of pain, continue current therapy      Mental Status:awake    Respiratory Function: tolerating room air    Cardiovascular: stable    Nausea/Vomiting: patient not complaining of nausea or vomiting    Hydration Status: adequate    Post assessment: no apparent anesthetic complications    Signed by: Justin Mend, CRNA  11/02/22 2:13 PM

## 2022-11-02 NOTE — Progress Notes (Signed)
Patient with difficult IV access. The patient and family member requested that the patient be masked for procedure and then IV started. Notified Anesthesia and CRNA and need for blood type confirmation.

## 2022-11-02 NOTE — Anesthesia Preprocedure Evaluation (Signed)
Anesthesia Evaluation    AIRWAY    Mallampati: I    TM distance: >3 FB  Neck ROM: full  Mouth Opening:full  Planned to use difficult airway equipment: No CARDIOVASCULAR    cardiovascular exam normal       DENTAL    no notable dental hx               PULMONARY    pulmonary exam normal     OTHER FINDINGS     89 y.o. female with PMH HTN, left hip replacement and breast cancer who is seen in the ED on 09/05/2022 with worsening left hip pain that radiates to the low back. L3 compression fracture     Lab             10/30/22                       0926          WBC          5.39          Hemoglobin   11.0*         Hematocrit   33.7*         Platelet Co* 317             Lab             10/30/22                       0926          Sodium       135           Potassium    3.9           Chloride     103           CO2          20            BUN          20            Creatinine   1.0  1.0     GFR          53.2*  53.*  Glucose      91            Calcium      10.7*                                               Relevant Problems   CARDIO   (+) Coronary artery disease involving native coronary artery of native heart without angina pectoris   (+) Essential hypertension      GI   (+) Gastroesophageal reflux disease      GU/RENAL   (+) Stage 3 chronic kidney disease               Anesthesia Plan    ASA 2     general                     intravenous induction   Detailed anesthesia plan: general endotracheal        Post op pain management: per surgeon    informed consent obtained  Signed by: Renaldo Fiddler, MD 11/02/22 10:47 AM

## 2022-11-02 NOTE — Final Progress Note (DC Note for stay less than 48 (Signed)
NEUROSURGERY POST OP CHECK     Date Time: 11/02/22 1:59 PM  Patient Name: Alexandra Graves, Alexandra Graves  MRN #[id number]    Subjective:   Patient s/p L3 kyphoplasty with Dr. Deloria Lair.   Resting comfortably in PACU. VSS   No n/v, Pain controlled    Physical Exam:     Vitals:    11/02/22 1052   BP: 189/88   Pulse: 60   Temp: 97.5 F (36.4 C)   SpO2: 100%       Intake and Output Summary (Last 24 hours) at Date Time    Intake/Output Summary (Last 24 hours) at 11/02/2022 1359  Last data filed at 11/02/2022 1316  Gross per 24 hour   Intake 50 ml   Output --   Net 50 ml       Appearance: NAD, emerging from anesthesia  CV: HDS, appears well-perfused  Pulm: Equal chest rise, appropriate effort    Neuro Exam: *Unchanged from pre-operative examination.  Mental Status: emerging from anesthesia  CN II-XII grossly intact  5/5 BUE/BLE  SILT    Assessment:   89 y.o. female POD#0 s/p L3 kyphoplasty. Patient tolerated procedure well and remains at neurological baseline.    Plan:   - Transfer to home when PACU criteria met.  - Vital signs per unit routine.  - q4h neurochecks  - SBP goal 100-150  - ad lib  - Multimodal pain control with QD bowel regimen  - Resume home medications  - ADAT to reg diet  - PT/OT eval and treat  - Encourage IS, mobilization  - SCDs    Izora Gala, MD  Neurosurgery, PGY-4  11/02/22  1:59 PM

## 2022-11-02 NOTE — Brief Op Note (Signed)
NEUROSURGERY BRIEF OP NOTE    Date Time: 11/02/22 1:56 PM    Patient Name:   Alexandra Graves    Date of Operation:   11/02/2022    Providers Performing:   Surgeon(s):  Marlaine Hind, MD  Izora Gala, MD    Operative Procedure:   Procedure(s):  L3 KYPHOPLASTY    Preoperative Diagnosis:   Pre-Op Diagnosis Codes:      * Lumbar radiculopathy [M54.16]    Postoperative Diagnosis:   * No post-op diagnosis entered *    Anesthesia:   General    Estimated Blood Loss:      5cc  Implants:     Implant Name Type Inv. Item Serial No. Manufacturer Lot No. LRB No. Used Action   CEMENT BONE MIXER GRADUATE MARK DISPENSE - [id number] Cement CEMENT BONE MIXER GRADUATE MARK DISPENSE  [employer] [id number] N/A 1 Implanted       Drains:   Drains: no    Specimens:     None.    Findings:   See dictated operative note.    Complications:   None    Condition:   Stable. Extubated to PACU.    Signed by: Izora Gala, MD, MD

## 2022-11-03 ENCOUNTER — Encounter: Payer: Self-pay | Admitting: Neurological Surgery

## 2022-11-04 LAB — ANTIBODY DIFF. XM MD REVIEW CPT 86077, REFLEX

## 2022-11-07 NOTE — Op Note (Signed)
Date of surgery 11/02/22     Surgeon Rosemarie Beath, MD, PhD     First Assistant Cindra Presume MD     Preop diagnosis     1 L3 compression fracture        2. Osteoporosis     3 Chronic kidney disease    4 Coronary artery disease      Postop diagnosis     1 L3 compression fracture        2. Osteoporosis     3 Chronic kidney disease    4 Coronary artery disease     Procedure     L3 kyphoplasty utilizing AP and lateral fluoroscopy     Indications: 89 y.o. female PMH HTN, left hip replacement and breast cancer, prior cardiac surgery on ASA who presents with mechanical back pain and left L3 radiculopathy. MRI demonstrates a subacute L3 compression fracture as well as moderate central canal stenosis at L2-3 as well as multilevel facet arthropathy.   Above procedure indicated.  Risk benefits discussed with patient and she elects to proceed.     Description of procedure: Patient brought to the operative room.  Patient intubated.  Patient placed in a prone position on on a bumpy Jackson table.  AP and lateral fluoroscopy was brought in.  Skin was marked.  Patient was prepped and draped in regular sterile fashion.  Small nick incision was made over the left pedicle of L3.  Jamshidi needle was placed into the posterior third of vertebral body at L3 through a trans pedicle approach on the left.  Twist drill was placed.  Kyphoplasty balloon was placed and inflated to 3 cc.  Balloon was then deflated and removed.  Bone cement was then placed through the cannula into the L1 vertebral body.  During the filling with cement fluoroscopy was used in the AP and lateral planes.  Good filling was noted but no cross-filling was evident.  5 cc was injected.  No extravasation of cement was noted.  Patient tolerated the procedure without event.  Final x-rays were obtained.        Dermabond glue was used to close the incision.  Gauze and Telfa was placed.  Patient returned to supine position extubated and transferred to recovery.        JFH

## 2022-11-09 ENCOUNTER — Telehealth: Payer: Self-pay

## 2022-11-09 NOTE — Provider Clarification Note (Signed)
Patient Name: Alexandra Graves, Alexandra Graves  Account #: [id number]   MR #: [id number]  Discharge Date: 11/02/2022    Dear Dr. Rosemarie Beath,     The medical record reflects the following:    Pre and Postoperative Diagnosis:    L3 compression fracture     Procedure:    L3 kyphoplasty utilizing AP and lateral fluoroscopy     Request to Physician: Please specify type of compression fracture:  ___1. Nontraumatic  ___2. Pathological  ___3. traumatic      Thank you,    Huntley Dec  Date:  11/08/2022        PROVIDER RESPONSE (Choose from list above or add free text): nontraumatic

## 2022-11-09 NOTE — Telephone Encounter (Signed)
<   Post Op Triage Nurse Progress Note >      Post op calls made after 11/02/22 Dr. Lennart Pall L3 Kyphoplasty.    Patient's spouse reported:   - Patient is doing fine.  - Pain is getting much better than before surgery, controlling under pain medications.  - Wound looks good, no signs of bleeding, discoloration, or swelling in and around the wound.    Educated patient's spouse on taking care of the incision.    Reviewed following appointments with patient's spouse:  - 11/16/22 10 AM Post op Wound check w/Gibson, RN  - 12/19/22 9:15 AM Post op visit w/Dr. Deloria Lair    All questions answered.       Iona Hansen, RN BSN   Neurosurgery Triage  [phone]

## 2022-11-14 ENCOUNTER — Ambulatory Visit (INDEPENDENT_AMBULATORY_CARE_PROVIDER_SITE_OTHER): Payer: Medicare Other | Admitting: Physician Assistant

## 2022-11-14 ENCOUNTER — Encounter (INDEPENDENT_AMBULATORY_CARE_PROVIDER_SITE_OTHER): Payer: Self-pay | Admitting: Physician Assistant

## 2022-11-14 DIAGNOSIS — Z23 Encounter for immunization: Secondary | ICD-10-CM

## 2022-11-14 NOTE — Progress Notes (Signed)
Influenza (Flu) vaccine administered in clinic today.  Influenza consent form scanned under media manager.   Patient tolerated without adverse event

## 2022-11-14 NOTE — Patient Instructions (Signed)
Influenza (Flu) Vaccine (Inactivated or Recombinant): What You Need to Know   This is a Vaccine Information Statement from the CDC.   Many vaccine information statements are available in Spanish and other languages. See PromoAge.com.br   Hojas de informaci?n sobre vacunas est?n disponibles en espa?ol y en muchos otros idiomas. Visite PromoAge.com.br   1. Why get vaccinated?  Influenza vaccine can prevent influenza (flu).   Flu is a contagious disease that spreads around the [place] every year, usually between October and May. Anyone can get the flu, but it is more dangerous for some people. Infants and young children, people 11 years and older, pregnant people, and people with certain health conditions or a weakened immune system are at greatest risk of flu complications.   Pneumonia, bronchitis, sinus infections, and ear infections are examples of flu-related complications. If you have a medical condition, such as heart disease, cancer, or diabetes, flu can make it worse.   Flu can cause fever and chills, sore throat, muscle aches, fatigue, cough, headache, and runny or stuffy nose. Some people may have vomiting and diarrhea, though this is more common in children than adults.   In an average year, thousands of people in the [place] die from flu, and many more are hospitalized. Flu vaccine prevents millions of illnesses and flu-related visits to the doctor each year.   2. Influenza vaccines  CDC recommends everyone 6 months and older get vaccinated every flu season. Children 6 months through 56 years of age may need 2 doses during a single flu season. Everyone else needs only 1 dose each flu season.   It takes about 2 weeks for protection to develop after vaccination.   There are many flu viruses, and they are always changing. Each year a new flu vaccine is made to protect against the influenza viruses believed to be likely to cause disease in the upcoming flu season. Even when the vaccine doesn?t exactly match these viruses, it may still provide some protection.   Influenza vaccine does not cause flu.   Influenza vaccine may be given at the same time as other vaccines.  3. Talk with your health care provider  Tell your vaccination provider if the person getting the vaccine:   Has had an allergic reaction after a previous dose of influenza vaccine , or has any severe, life-threatening allergies  Has ever had Guillain-Barr? Syndrome (also called ?GBS?)  In some cases, your health care provider may decide to postpone influenza vaccination until a future visit.   Influenza vaccine can be administered at any time during pregnancy. People who are or will be pregnant during influenza season should receive inactivated influenza vaccine.   People with minor illnesses, such as a cold, may be vaccinated. People who are moderately or severely ill should usually wait until they recover before getting influenza vaccine.   Your health care provider can give you more information.   4. Risks of a vaccine reaction  Soreness, redness, and swelling where the shot is given, fever, muscle aches, and headache can happen after influenza vaccination.  There may be a very small increased risk of Guillain-Barr? Syndrome (GBS) after inactivated influenza vaccine (the flu shot).  Young children who get the flu shot along with pneumococcal vaccine (PCV13) and/or DTaP vaccine at the same time might be slightly more likely to have a seizure caused by fever. Tell your health care provider if a child who is getting flu vaccine has ever had a seizure.  People sometimes faint after medical procedures, including vaccination. Tell your provider if you feel dizzy or have vision changes or ringing in the ears.   As with any medicine, there is a very remote chance of a vaccine causing a severe allergic reaction, other serious injury, or death.   5. What if there is a serious problem?  An allergic reaction could occur after the vaccinated person leaves the clinic. If you see signs of a severe allergic reaction (hives, swelling of the face and throat, difficulty breathing, a fast heartbeat, dizziness, or weakness), call 9-1-1 and get the person to the nearest hospital.   For other signs that concern you, call your health care provider.   Adverse reactions should be reported to the Vaccine Adverse Event Reporting System (VAERS). Your health care provider will usually file this report, or you can do it yourself. Visit the VAERS website at www.vaers.LAgents.no or call 629 879 8011. VAERS is only for reporting reactions, and VAERS staff members do not give medical advice.   6. The National Vaccine Injury Compensation Program  The [employer] Vaccine Injury Compensation Program (VICP) is a federal program that was created to compensate people who may have been injured by certain vaccines. Claims regarding alleged injury or death due to vaccination have a time limit for filing, which may be as short as two years. Visit the VICP website at SpiritualWord.at or call 914-228-3748 to learn about the program and about filing a claim.   7. How can I learn more?  Ask your health care provider.  Call your local or state health department.  Visit the website of the Food and Drug Administration (FDA) for vaccine package inserts and additional information at FinderList.no.  Contact the Centers for Disease Control and Prevention (CDC): Call 226 494 2572 ( 1-800-CDC-INFO) or  Visit CDC?s website at BiotechRoom.com.cy.  Vaccine Information Statement   Inactivated Influenza Vaccine  42 [place] ? 300aa-26  09/12/2019  StayWell last reviewed this educational content on   ? 2000-2023 [employer], [place]. All rights reserved. This information is not intended as a substitute for professional medical care. Always follow your healthcare professional's instructions.

## 2022-11-16 ENCOUNTER — Other Ambulatory Visit: Payer: Self-pay | Admitting: Physician Assistant

## 2022-11-16 ENCOUNTER — Ambulatory Visit: Payer: Medicare Other | Attending: Neurological Surgery

## 2022-11-16 VITALS — BP 134/69 | HR 69 | Temp 96.4°F | Resp 15 | Ht 60.0 in | Wt 100.2 lb

## 2022-11-16 DIAGNOSIS — Z5189 Encounter for other specified aftercare: Secondary | ICD-10-CM | POA: Insufficient documentation

## 2022-11-16 DIAGNOSIS — Z9889 Other specified postprocedural states: Secondary | ICD-10-CM | POA: Insufficient documentation

## 2022-11-16 DIAGNOSIS — M25552 Pain in left hip: Secondary | ICD-10-CM

## 2022-11-16 MED ORDER — OXYCODONE HCL 5 MG PO TABS
5.0000 mg | ORAL_TABLET | Freq: Three times a day (TID) | ORAL | 0 refills | Status: AC | PRN
Start: 2022-11-16 — End: ?
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
## 2022-11-16 NOTE — Patient Instructions (Signed)
Contact office if any question or concerns

## 2022-11-22 ENCOUNTER — Ambulatory Visit: Payer: Medicare Other | Admitting: Neurological Surgery

## 2022-12-19 ENCOUNTER — Ambulatory Visit: Payer: Medicare Other | Admitting: Neurological Surgery

## 2023-02-01 IMAGING — NM THREE PHASE BONE SCAN
1 series · 6 of 6 positions shown · non-contrast
Comparison: CT exam of 07/25/2018.

________________________________________________________________________________________________ 
THREE PHASE BONE SCAN, 02/01/2023 [DATE]: 
CLINICAL INDICATION: Concern for implant loosening. Trochanteric Bursitis, Left 
Hip.
TECHNIQUE: After the intravenous administration of 24.4 mCi of 5c-33m 
medronate, blood flow, blood pool and delayed images were obtained over the 
hips.

[Series 1000: flow · 4.80mm/px · 6 of 240 frames shown]
[frame 21/240  full-range]
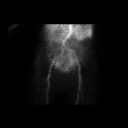
[frame 61/240  full-range]
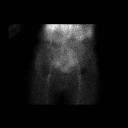
[frame 101/240  full-range]
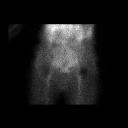
[frame 141/240  full-range]
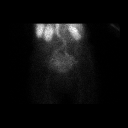
[frame 181/240  full-range]
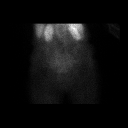
[frame 221/240  full-range]
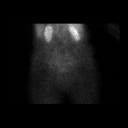

[6 of 6 positions shown; findings below may reference images not displayed]

FINDINGS: No abnormal blood flow or blood pool radiotracer accumulation about 
the hips. Delayed views include photopenic defect left hip compatible with known 
left TKA. No periprosthetic delayed radiotracer uptake to suggest mechanical 
loosening. Lumbar dextrocurvature. There is diffuse delayed radiotracer uptake 
involving a lumbar vertebral body. No vertebral body fracture on the prior MR 
examination the abdomen of 07/13/2022 involving the lumbar spine. Scattered 
degenerative type uptake including the right hip. Normal soft tissue uptake.
IMPRESSION: 1.   Left TKA with negative triple phase bone scan about the left hip. 
2.  Diffuse delayed radiotracer uptake involving the mid lumbar spine. 
Consideration could be made for MR exam of the lumbar spine to exclude acute 
fracture.

## 2023-04-02 IMAGING — CT CT ABDOMEN AND PELVIS WITH CONTRAST
2 of 3 series · 15 of 46 positions shown, 17 images · IV contrast (ISOVUE 300)
Comparison: None   
A search for DICOM formatted images was conducted for prior CT imaging studies 
completed at a non-affiliated media free facility.

________________________________________________________________________________________________ 
CT ABDOMEN AND PELVIS WITH CONTRAST, 04/02/2023 [DATE]: 
CLINICAL INDICATION: Flatulence Eructation/gas Pain , bloating for 8 months. 
History of breast carcinoma 0874. Previous cholecystectomy.
TECHNIQUE: The abdomen and pelvis was scanned from lung bases through the pubic 
rami with 75 mL of Isovue 300 MDV on a high-resolution CT scanner using dose 
reduction techniques. Routine MPR reconstructions were performed. The patients 
eGFR was calculated to be 52.2 mL/min/1.73 m2 using the i-STAT device.

[Series 4: abd/pel ax w · axial · 0.67mm/px · z∈[-582,-192]mm · 12 of 150 slices shown, 14 images]
[im 10/150  soft-tissue]
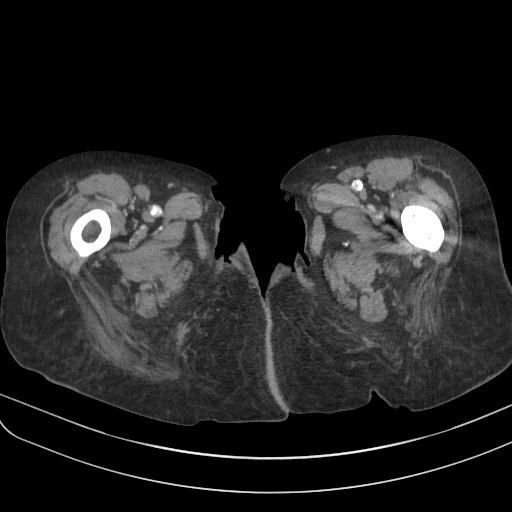
[im 10/150  bone]
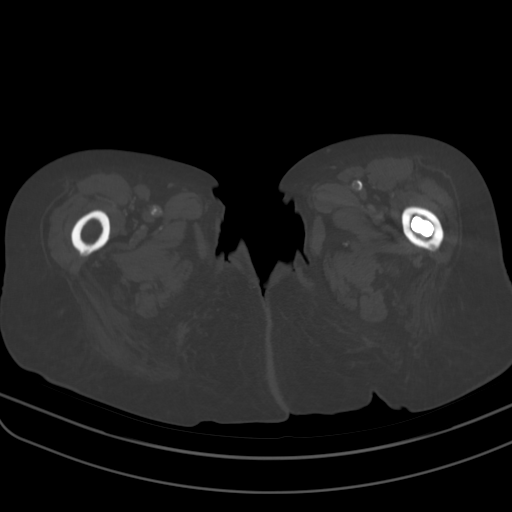
[im 20/150  soft-tissue]
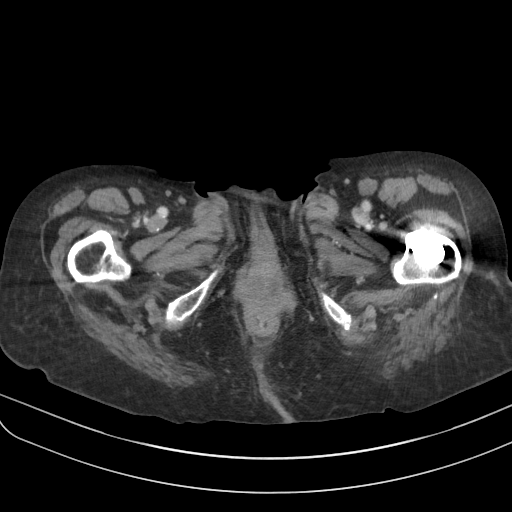
[im 34/150  soft-tissue]
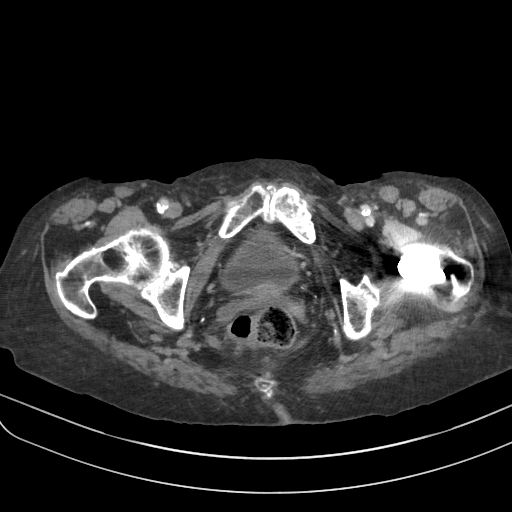
[im 44/150  soft-tissue]
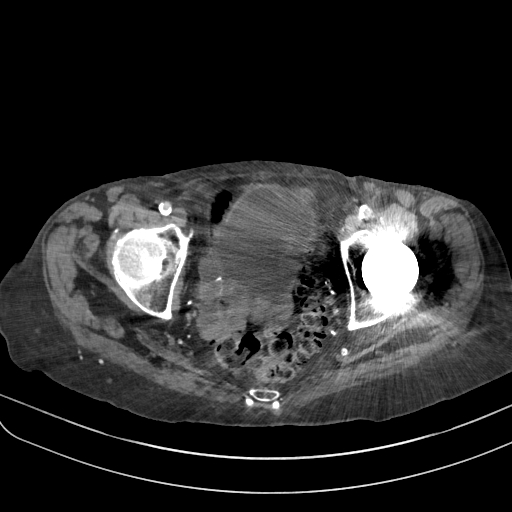
[im 58/150  soft-tissue]
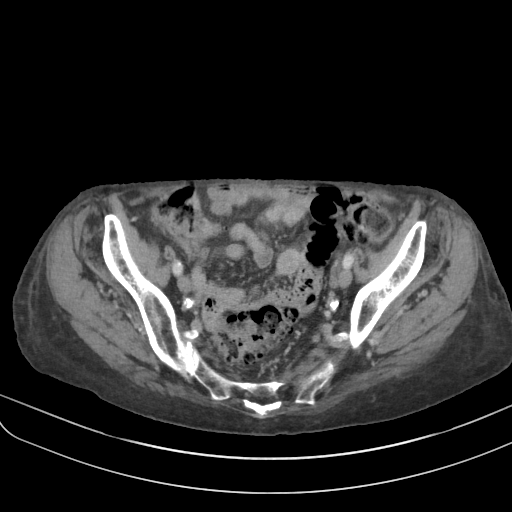
[im 68/150  soft-tissue]
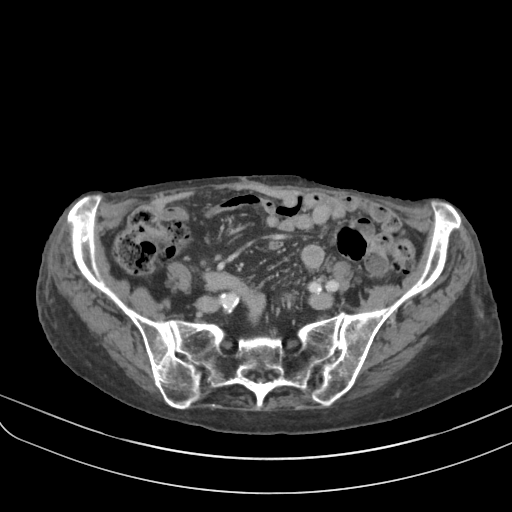
[im 82/150  soft-tissue]
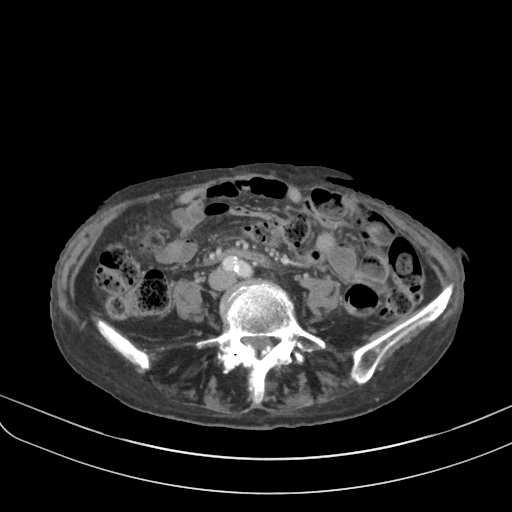
[im 92/150  soft-tissue]
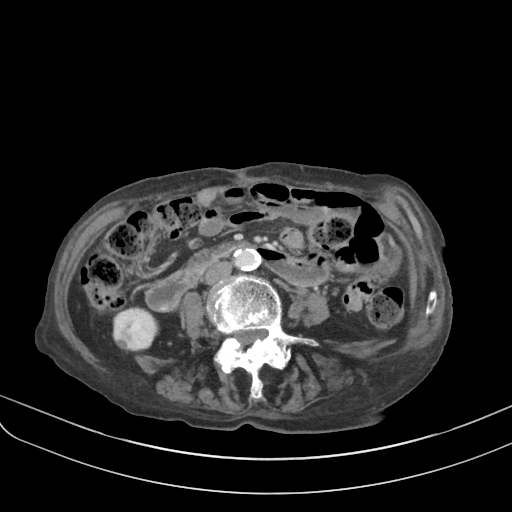
[im 106/150  soft-tissue]
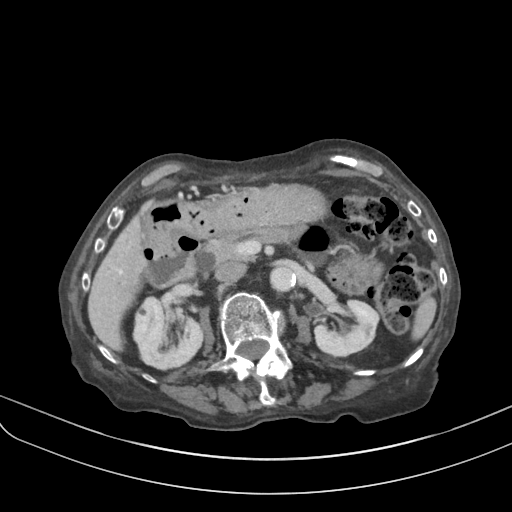
[im 106/150  bone]
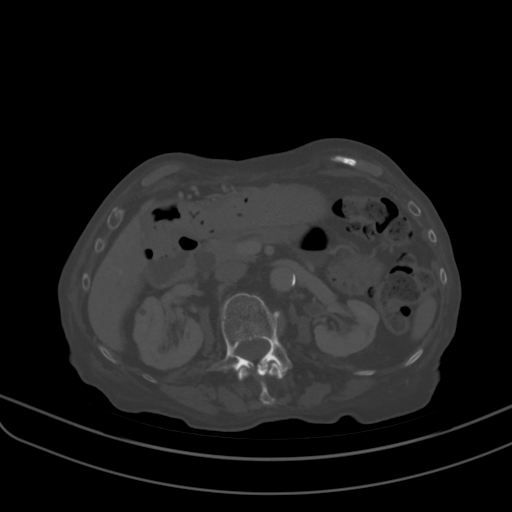
[im 116/150  soft-tissue]
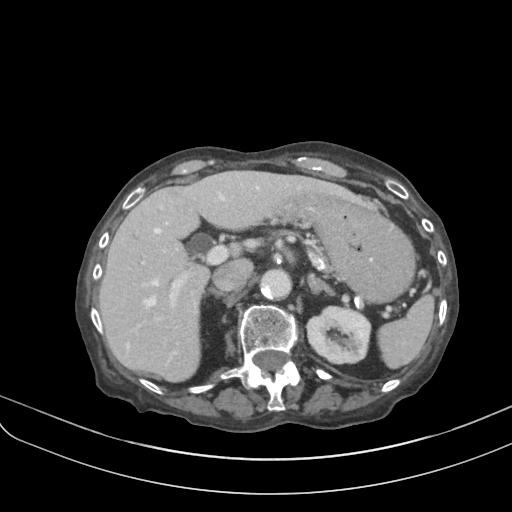
[im 130/150  soft-tissue]
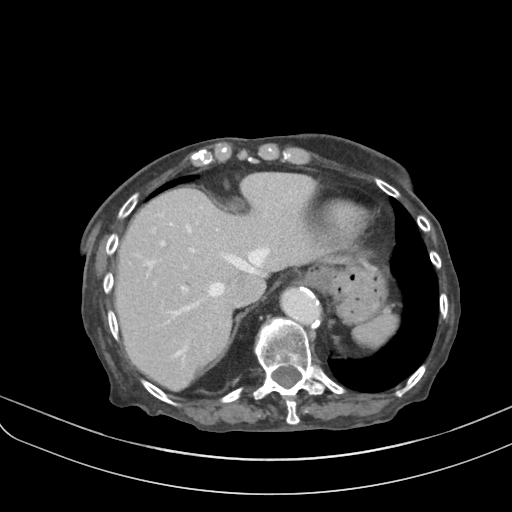
[im 140/150  soft-tissue]
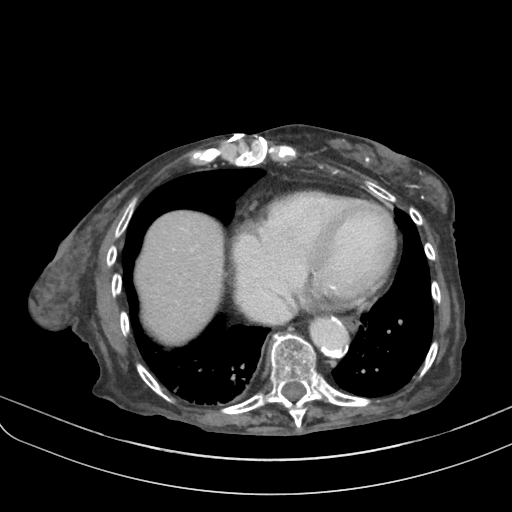

[Series 6: cor from thins · coronal · 0.68mm/px · 3 of 101 slices shown]
[im 34/101  soft-tissue]
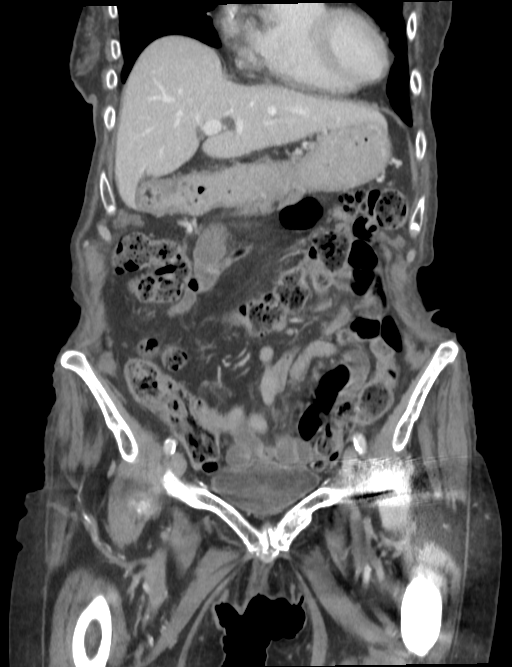
[im 45/101  soft-tissue]
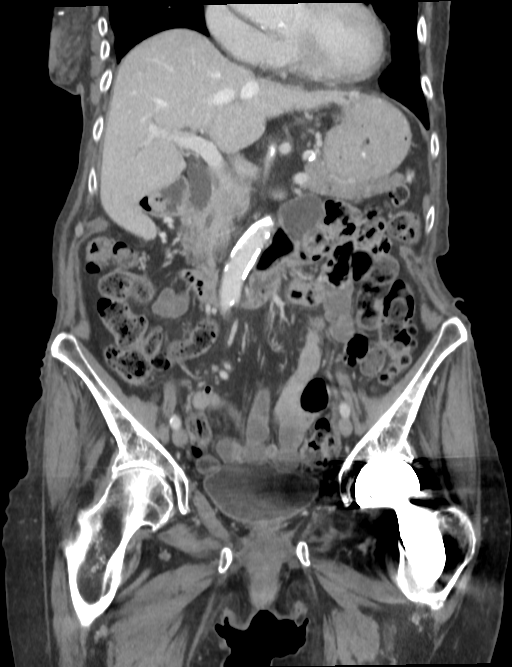
[im 56/101  soft-tissue]
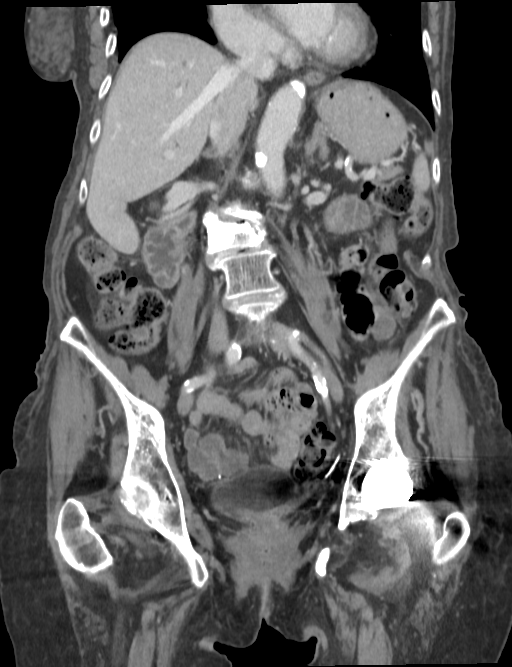

[15 of 46 positions shown; findings below may reference images not displayed]

Count of known CT and Cardiac Nuclear Medicine studies performed in the previous 
12 months = 0.
FINDINGS: LUNG BASES: Right basilar atelectasis. Postoperative changes. At least mild 
coronary calcifications right coronary circulation. 
HEPATOBILIARY: Previous cholecystectomy. Prominent common bile duct measuring up 
to 13 mm on coronal image 47. No significant intrahepatic ductal dilatation. 
Simple cyst are seen. No abnormal enhancement. 
SPLEEN: Normal in size. 
PANCREAS: No ductal dilatation or mass.   
ADRENALS: No mass. 
GENITOURINARY: No enhancing mass or hydronephrosis.  Bilateral cortical cyst. 
Bladder is unremarkable. 
LYMPH NODES: No adenopathy. 
STOMACH, SMALL BOWEL AND COLON: Moderate volume of stool throughout the colon. 
Stomach is decompressed. No bowel wall thickening or obstructive changes. 
Diverticulosis without CT evidence of active diverticulitis. 
VASCULAR STRUCTURES: Atherosclerotic changes including calcification of the 
origin of the mesenteric vessels especially the superior mesenteric artery with 
stenosis greater than 70% thought to be present. However, the celiac artery and 
inferior mesenteric artery are widely patent. No aneurysm.  
MUSCULOSKELETAL: No acute osseous abnormality. Scattered degenerative changes. 
Postop changes left hip. 
ADDITIONAL FINDINGS: None.
IMPRESSION: Atherosclerotic changes with significant calcification especially within the 
superior mesenteric artery. 
No bowel wall thickening or obstructive changes. Moderate volume of stool the 
colon. 
Postop changes and right basilar atelectasis. 
Postop changes left hip., Prominent common bile duct without intrahepatic ductal 
dilatation. Clinical correlation with liver function test. 
RADIATION DOSE REDUCTION: All CT scans are performed using radiation dose 
reduction techniques, when applicable.  Technical factors are evaluated and 
adjusted to ensure appropriate moderation of exposure.  Automated dose 
management technology is applied to adjust the radiation doses to minimize 
exposure while achieving diagnostic quality images.

## 2023-05-24 IMAGING — MG MAMMOGRAPHY SCREENING BILATERAL 3[PERSON_NAME]
8 series · 9 of 24 positions shown · non-contrast
Comparison: Comparison was made to prior examinations.

________________________________________________________________________________________________ 
MAMMOGRAPHY SCREENING BILATERAL 3[facility] V[facility], 05/24/2023 [DATE]: 
CLINICAL INDICATION: Encounter for screening mammogram. Previous left breast 
lumpectomy.
TECHNIQUE: Digital bilateral mammograms and 3-D Tomosynthesis were obtained. 
These were interpreted both primarily and with the aid of computer-aided 
detection system.  
BREAST DENSITY: (Level D) The breasts are extremely dense, which lowers the 
sensitivity of mammography.

[R MLO]
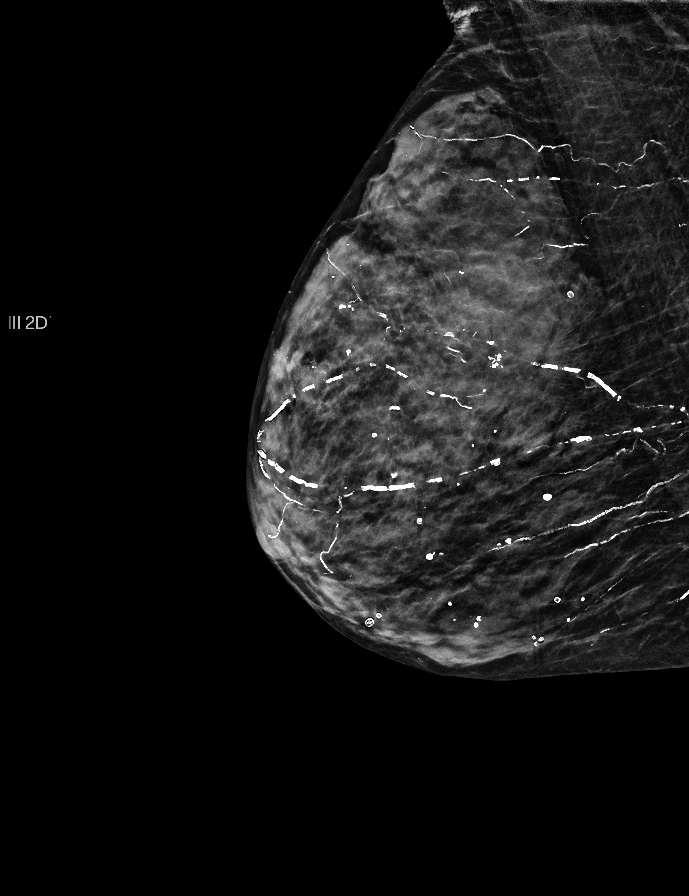

[L CC]
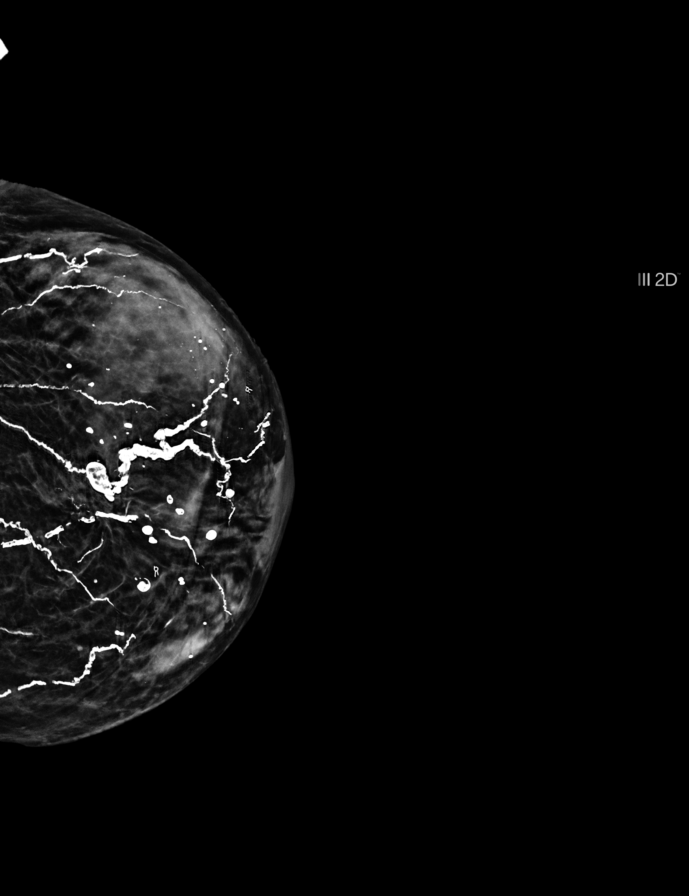

[L MLO]
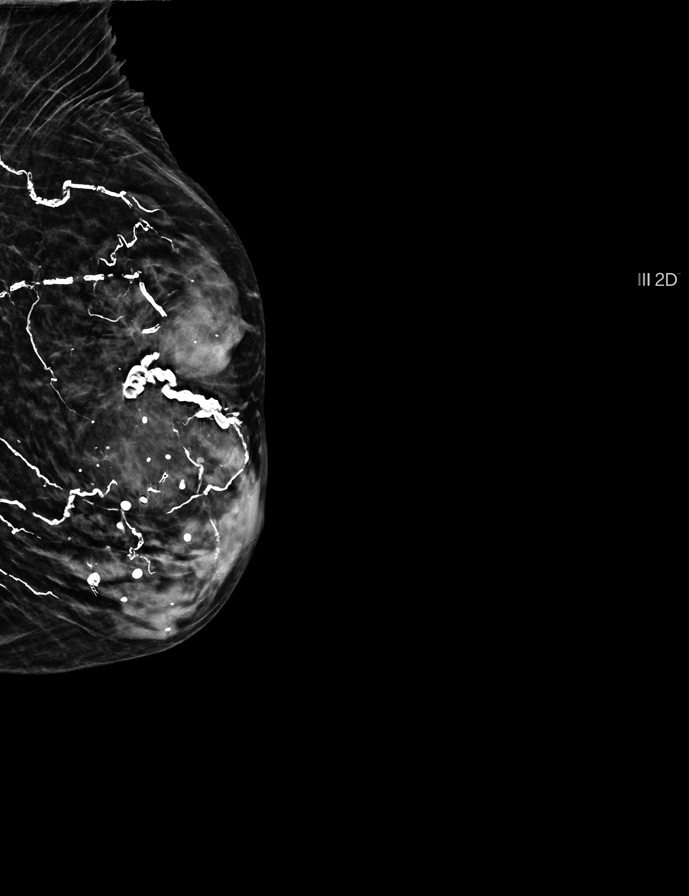

[R CC]
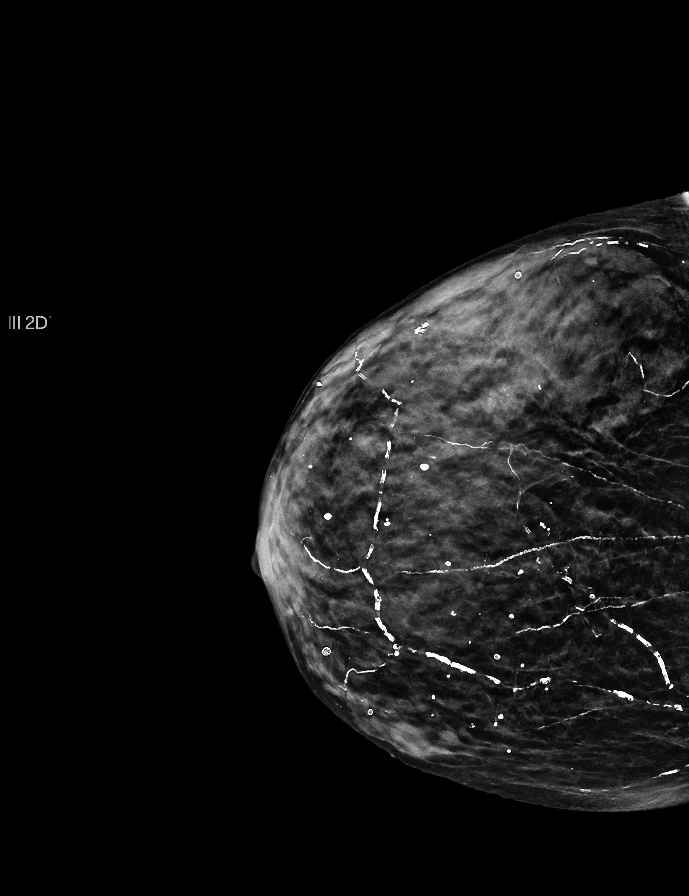

[R CC tomo · 2 of 12 frames shown]
[frame 5/12]
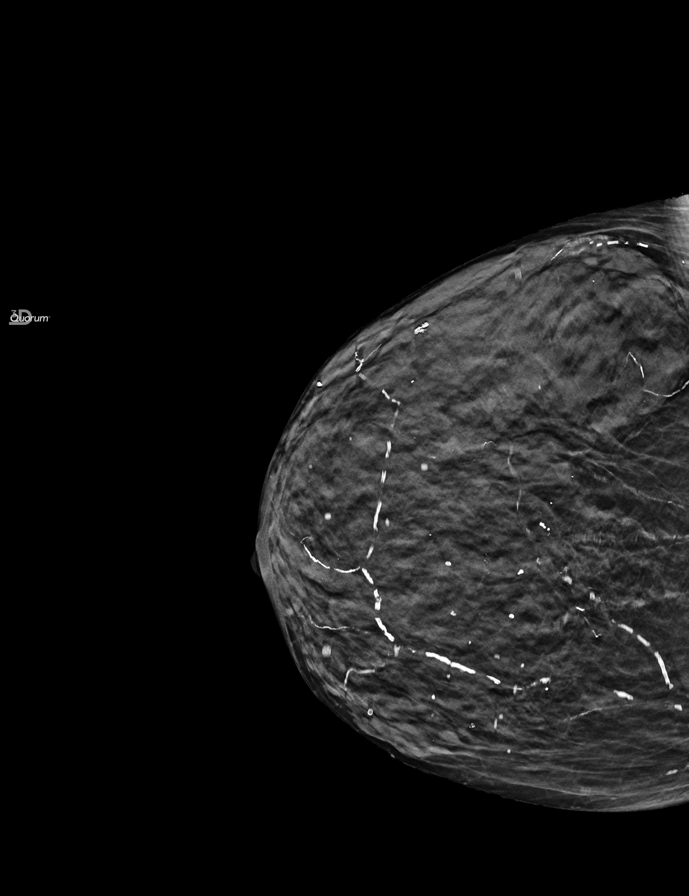
[frame 7/12]
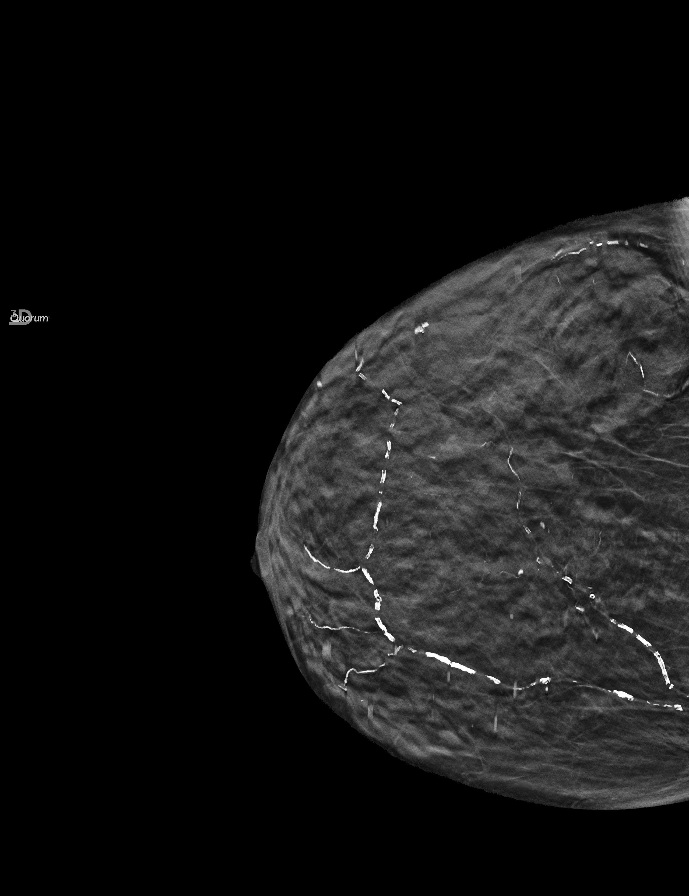

[R MLO tomo · tomo slice 6/11.0]
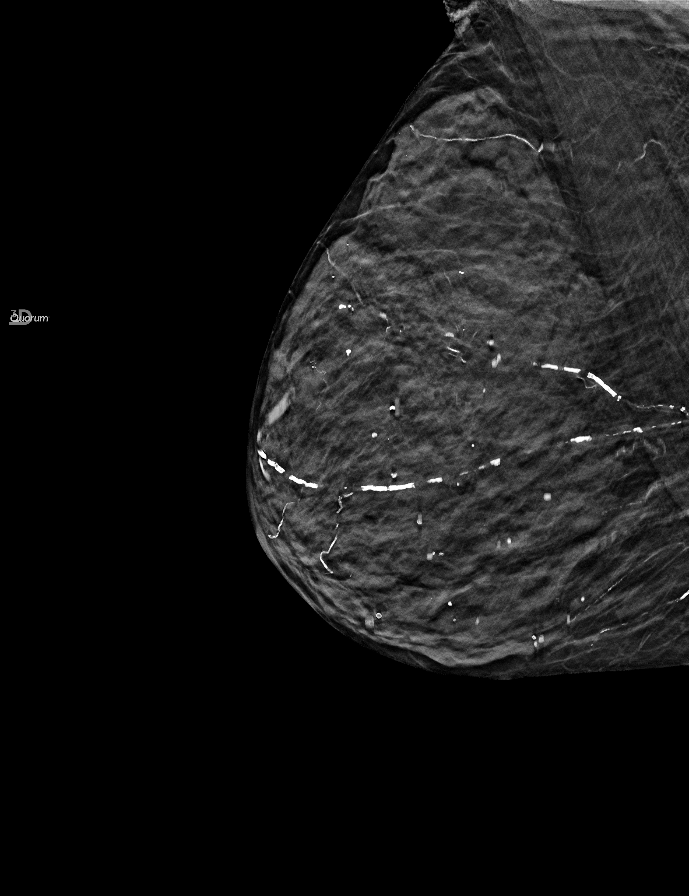

[L CC tomo · tomo slice 8/15.0]
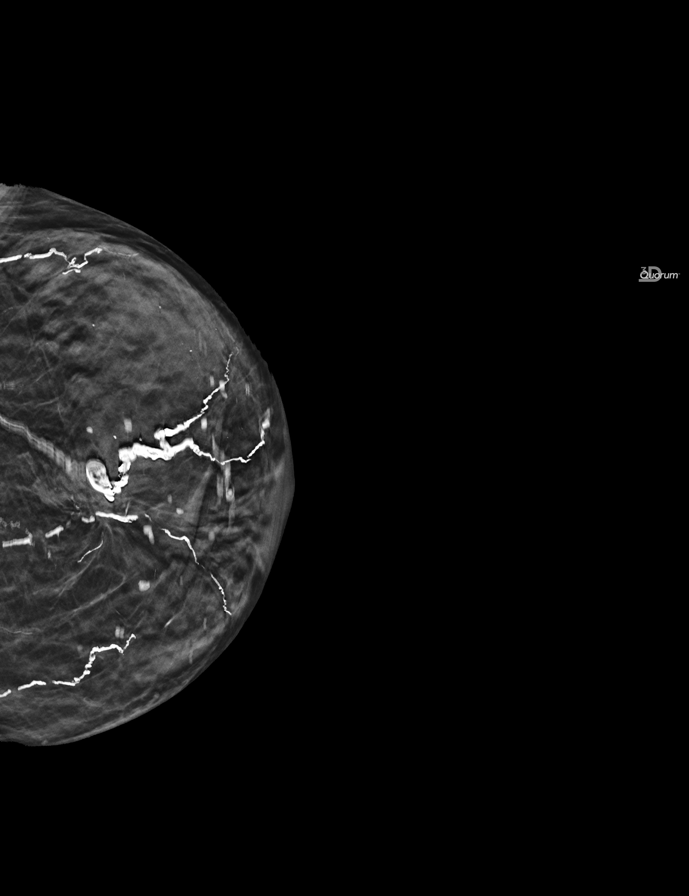

[L MLO tomo · tomo slice 7/14.0]
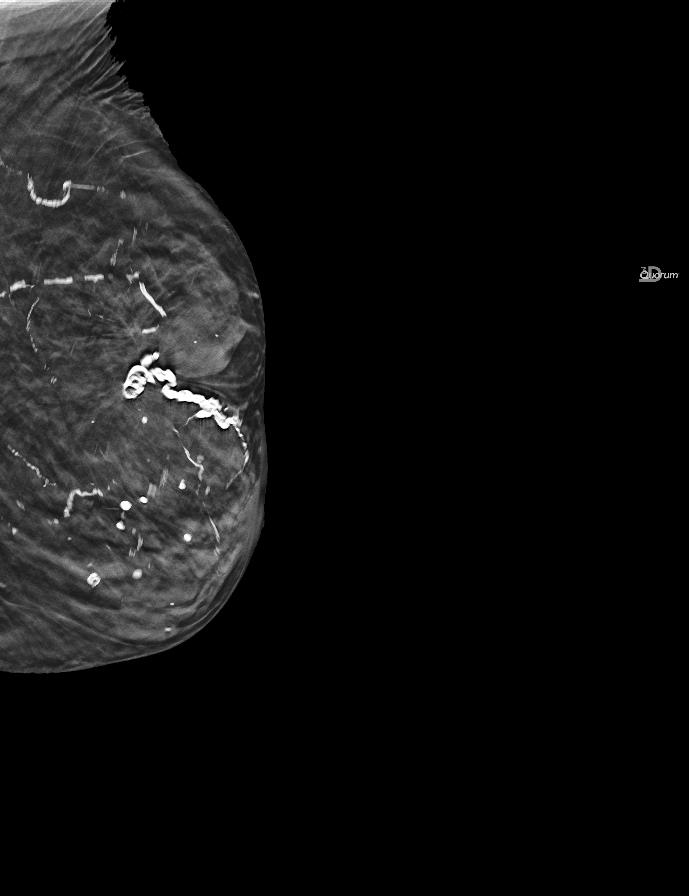

[9 of 24 positions shown; findings below may reference images not displayed]

FINDINGS: No suspicious mass, calcifications, or area of architectural 
distortion in either breast. Vascular calcifications and stable scarring in the 
left breast.
IMPRESSION: (BI-RADS 2) Benign findings. Routine mammographic follow-up is recommended.
